# Patient Record
Sex: Female | Born: 1935 | Race: White | Hispanic: No | Marital: Married | Smoking: Former smoker
Health system: Southern US, Community
[De-identification: ages and names within clinical notes are randomized; demographics above are authoritative.]

## PROBLEM LIST (undated history)

## (undated) DIAGNOSIS — R0902 Hypoxemia: Secondary | ICD-10-CM

## (undated) DIAGNOSIS — I4891 Unspecified atrial fibrillation: Secondary | ICD-10-CM

## (undated) DIAGNOSIS — I509 Heart failure, unspecified: Secondary | ICD-10-CM

## (undated) DIAGNOSIS — C8590 Non-Hodgkin lymphoma, unspecified, unspecified site: Secondary | ICD-10-CM

## (undated) DIAGNOSIS — K116 Mucocele of salivary gland: Secondary | ICD-10-CM

## (undated) DIAGNOSIS — M1990 Unspecified osteoarthritis, unspecified site: Secondary | ICD-10-CM

## (undated) DIAGNOSIS — I2699 Other pulmonary embolism without acute cor pulmonale: Secondary | ICD-10-CM

## (undated) HISTORY — PX: APPENDECTOMY: SHX54

## (undated) HISTORY — DX: Heart failure, unspecified: I50.9

## (undated) HISTORY — PX: VERTEBROPLASTY: SHX113

## (undated) HISTORY — PX: ABDOMINAL HYSTERECTOMY: SHX81

## (undated) HISTORY — PX: BREAST BIOPSY: SHX20

## (undated) HISTORY — DX: Mucocele of salivary gland: K11.6

## (undated) HISTORY — DX: Non-Hodgkin lymphoma, unspecified, unspecified site: C85.90

## (undated) HISTORY — DX: Other pulmonary embolism without acute cor pulmonale: I26.99

## (undated) HISTORY — PX: MOHS SURGERY: SUR867

## (undated) HISTORY — DX: Unspecified osteoarthritis, unspecified site: M19.90

## (undated) HISTORY — DX: Hypoxemia: R09.02

## (undated) HISTORY — PX: CATARACT EXTRACTION: SUR2

## (undated) HISTORY — DX: Unspecified atrial fibrillation: I48.91

---

## 2004-03-17 ENCOUNTER — Ambulatory Visit: Payer: Self-pay | Admitting: Oncology

## 2004-05-29 ENCOUNTER — Ambulatory Visit: Payer: Self-pay | Admitting: Oncology

## 2004-06-17 ENCOUNTER — Ambulatory Visit: Payer: Self-pay | Admitting: Oncology

## 2004-07-18 ENCOUNTER — Ambulatory Visit: Payer: Self-pay | Admitting: Oncology

## 2004-09-26 ENCOUNTER — Ambulatory Visit: Payer: Self-pay | Admitting: Oncology

## 2004-10-15 ENCOUNTER — Ambulatory Visit: Payer: Self-pay | Admitting: Oncology

## 2004-11-15 ENCOUNTER — Ambulatory Visit: Payer: Self-pay | Admitting: Oncology

## 2004-12-15 ENCOUNTER — Ambulatory Visit: Payer: Self-pay | Admitting: Oncology

## 2005-01-24 ENCOUNTER — Ambulatory Visit: Payer: Self-pay | Admitting: Oncology

## 2005-02-15 ENCOUNTER — Ambulatory Visit: Payer: Self-pay | Admitting: Oncology

## 2005-03-17 ENCOUNTER — Ambulatory Visit: Payer: Self-pay | Admitting: Oncology

## 2005-05-23 ENCOUNTER — Ambulatory Visit: Payer: Self-pay | Admitting: Oncology

## 2005-06-17 ENCOUNTER — Ambulatory Visit: Payer: Self-pay | Admitting: Oncology

## 2005-09-24 ENCOUNTER — Ambulatory Visit: Payer: Self-pay | Admitting: Oncology

## 2005-10-15 ENCOUNTER — Ambulatory Visit: Payer: Self-pay | Admitting: Oncology

## 2005-11-15 ENCOUNTER — Ambulatory Visit: Payer: Self-pay | Admitting: Oncology

## 2005-12-30 ENCOUNTER — Ambulatory Visit: Payer: Self-pay | Admitting: Internal Medicine

## 2006-01-22 ENCOUNTER — Ambulatory Visit: Payer: Self-pay | Admitting: Oncology

## 2006-02-15 ENCOUNTER — Ambulatory Visit: Payer: Self-pay | Admitting: Oncology

## 2006-07-07 ENCOUNTER — Emergency Department: Payer: Self-pay | Admitting: Emergency Medicine

## 2006-07-23 ENCOUNTER — Ambulatory Visit: Payer: Self-pay | Admitting: Oncology

## 2006-08-16 ENCOUNTER — Ambulatory Visit: Payer: Self-pay | Admitting: Oncology

## 2007-01-07 ENCOUNTER — Ambulatory Visit: Payer: Self-pay | Admitting: Internal Medicine

## 2007-01-16 ENCOUNTER — Ambulatory Visit: Payer: Self-pay | Admitting: Oncology

## 2007-01-19 ENCOUNTER — Ambulatory Visit: Payer: Self-pay | Admitting: Oncology

## 2007-02-16 ENCOUNTER — Ambulatory Visit: Payer: Self-pay | Admitting: Oncology

## 2007-03-03 ENCOUNTER — Ambulatory Visit: Payer: Self-pay | Admitting: Family

## 2007-07-19 ENCOUNTER — Ambulatory Visit: Payer: Self-pay | Admitting: Oncology

## 2007-07-27 ENCOUNTER — Ambulatory Visit: Payer: Self-pay | Admitting: Oncology

## 2007-08-16 ENCOUNTER — Ambulatory Visit: Payer: Self-pay | Admitting: Oncology

## 2007-09-17 ENCOUNTER — Ambulatory Visit: Payer: Self-pay | Admitting: Family

## 2008-01-11 ENCOUNTER — Ambulatory Visit: Payer: Self-pay | Admitting: Oncology

## 2008-01-16 ENCOUNTER — Ambulatory Visit: Payer: Self-pay | Admitting: Oncology

## 2008-02-08 ENCOUNTER — Ambulatory Visit: Payer: Self-pay | Admitting: Oncology

## 2008-02-16 ENCOUNTER — Ambulatory Visit: Payer: Self-pay | Admitting: Oncology

## 2008-02-17 ENCOUNTER — Ambulatory Visit: Payer: Self-pay | Admitting: Family

## 2008-09-26 ENCOUNTER — Inpatient Hospital Stay: Payer: Self-pay | Admitting: Internal Medicine

## 2008-10-10 ENCOUNTER — Inpatient Hospital Stay: Payer: Self-pay | Admitting: Internal Medicine

## 2008-10-22 ENCOUNTER — Inpatient Hospital Stay: Payer: Self-pay | Admitting: Internal Medicine

## 2009-10-05 ENCOUNTER — Inpatient Hospital Stay: Payer: Self-pay | Admitting: Orthopedic Surgery

## 2009-10-06 ENCOUNTER — Encounter: Payer: Self-pay | Admitting: Internal Medicine

## 2009-10-15 ENCOUNTER — Encounter: Payer: Self-pay | Admitting: Internal Medicine

## 2009-11-01 ENCOUNTER — Ambulatory Visit: Payer: Self-pay | Admitting: Internal Medicine

## 2009-11-07 ENCOUNTER — Ambulatory Visit: Payer: Self-pay | Admitting: Internal Medicine

## 2009-11-08 ENCOUNTER — Encounter: Payer: Self-pay | Admitting: Internal Medicine

## 2009-11-09 DIAGNOSIS — I4891 Unspecified atrial fibrillation: Secondary | ICD-10-CM | POA: Insufficient documentation

## 2009-11-09 DIAGNOSIS — M810 Age-related osteoporosis without current pathological fracture: Secondary | ICD-10-CM | POA: Insufficient documentation

## 2009-11-09 DIAGNOSIS — R29898 Other symptoms and signs involving the musculoskeletal system: Secondary | ICD-10-CM | POA: Insufficient documentation

## 2009-11-09 DIAGNOSIS — Z86718 Personal history of other venous thrombosis and embolism: Secondary | ICD-10-CM

## 2009-11-10 DIAGNOSIS — Z8572 Personal history of non-Hodgkin lymphomas: Secondary | ICD-10-CM

## 2009-11-20 ENCOUNTER — Ambulatory Visit: Payer: Self-pay | Admitting: Internal Medicine

## 2009-11-20 DIAGNOSIS — I5022 Chronic systolic (congestive) heart failure: Secondary | ICD-10-CM

## 2009-11-21 LAB — CONVERTED CEMR LAB
Albumin: 4.5 g/dL (ref 3.5–5.2)
BUN: 11 mg/dL (ref 6–23)
Basophils Absolute: 0 10*3/uL (ref 0.0–0.1)
CO2: 31 meq/L (ref 19–32)
Calcium: 9.9 mg/dL (ref 8.4–10.5)
Creatinine, Ser: 0.7 mg/dL (ref 0.4–1.2)
Digitoxin Lvl: 1.2 ng/mL (ref 0.8–2.0)
Eosinophils Absolute: 0.1 10*3/uL (ref 0.0–0.7)
Glucose, Bld: 101 mg/dL — ABNORMAL HIGH (ref 70–99)
HCT: 42.2 % (ref 36.0–46.0)
Lymphs Abs: 1.7 10*3/uL (ref 0.7–4.0)
MCHC: 34.4 g/dL (ref 30.0–36.0)
MCV: 98.2 fL (ref 78.0–100.0)
Monocytes Absolute: 0.4 10*3/uL (ref 0.1–1.0)
Neutrophils Relative %: 70.3 % (ref 43.0–77.0)
Platelets: 170 10*3/uL (ref 150.0–400.0)
RDW: 13.7 % (ref 11.5–14.6)
Sodium: 141 meq/L (ref 135–145)
WBC: 7.5 10*3/uL (ref 4.5–10.5)

## 2010-02-28 ENCOUNTER — Ambulatory Visit: Payer: Self-pay | Admitting: Internal Medicine

## 2010-05-16 ENCOUNTER — Ambulatory Visit: Payer: Self-pay | Admitting: Internal Medicine

## 2010-05-16 ENCOUNTER — Encounter: Payer: Self-pay | Admitting: Internal Medicine

## 2010-05-16 DIAGNOSIS — I959 Hypotension, unspecified: Secondary | ICD-10-CM

## 2010-05-17 LAB — CONVERTED CEMR LAB
BUN: 17 mg/dL (ref 6–23)
Basophils Absolute: 0 10*3/uL (ref 0.0–0.1)
CO2: 32 meq/L (ref 19–32)
Creatinine, Ser: 0.9 mg/dL (ref 0.4–1.2)
Digitoxin Lvl: 0.2 ng/mL — ABNORMAL LOW (ref 0.8–2.0)
Eosinophils Relative: 1.1 % (ref 0.0–5.0)
GFR calc non Af Amer: 66.49 mL/min (ref >60)
Glucose, Bld: 92 mg/dL (ref 70–99)
HCT: 44.2 % (ref 36.0–46.0)
Lymphs Abs: 1.8 10*3/uL (ref 0.7–4.0)
Monocytes Absolute: 0.5 10*3/uL (ref 0.1–1.0)
Monocytes Relative: 6.6 % (ref 3.0–12.0)
Neutrophils Relative %: 66.5 % (ref 43.0–77.0)
Platelets: 161 10*3/uL (ref 150.0–400.0)
RDW: 13.4 % (ref 11.5–14.6)
Sodium: 137 meq/L (ref 135–145)
WBC: 6.9 10*3/uL (ref 4.5–10.5)

## 2010-05-25 ENCOUNTER — Ambulatory Visit: Payer: Self-pay | Admitting: Internal Medicine

## 2010-07-17 NOTE — Letter (Signed)
[facility]   Imported By: Beau Fanny 11/10/2009 14:40:56  _____________________________________________________________________  External Attachment:    Type:   Image     Comment:   External Document

## 2010-07-17 NOTE — Assessment & Plan Note (Signed)
Summary: ROA FOR 2 WEEK FOLLOW-UP/JRR   Vital Signs:  Patient profile:   77 year old female Weight:      113 pounds Temp:     97.7 degrees F oral Pulse rate:   92 / minute Pulse rhythm:   regular BP sitting:   110 / 60  (left arm) Cuff size:   regular  Vitals Entered By: Mervin Hack CMA Duncan Dull) (May 25, 2010 2:40 PM)  History of Present Illness: here with daughter Ruben Gottron  feels better now BP has been running 102/70's still with some dizziness upon just getting up---has to watch balance. She clearly describes a balance issue as opposed to an orthostatic issue  has not used the lasix No SOB  appetite still not great 2 ensures daily though  No chest pain Occ racing sensation  Allergies: No Known Drug Allergies  Past History:  Past medical, surgical, family and social histories (including risk factors) reviewed for relevance to current acute and chronic problems.  Past Medical History: Reviewed history from 11/09/2009 and no changes required. Congestive Heart Failure-----toxicity from chemo Atrial fibrillation-------------------------off coumadin per Dr. Gwen Pounds Non-Hodgkin's Lymphoma Osteoarthritis Pulmonary embolism--- with lymphoma Osteoporosis Parotid Cyst  Past Surgical History: Reviewed history from 11/09/2009 and no changes required. Hysterectomy Cataract extraction Appendectomy Breast Biopsies T12 Vertebroplasty Moh's surgery times 2-face  Family History: Reviewed history from 11/09/2009 and no changes required. Dad died in 75's  of cancer Mom died @89  of heart disease Brother and sister died of cancer  Social History: Reviewed history from 11/09/2009 and no changes required. 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  NAD Neck:  supple, no  masses, and no thyromegaly.   Lungs:  normal respiratory effort, no intercostal retractions, no accessory muscle use, and normal breath sounds.   Heart:  normal rate, regular rhythm, no murmur, and no gallop.   Extremities:  no edema Psych:  normally interactive, good eye contact, not anxious appearing, and not depressed appearing.     Impression & Recommendations:  Problem # 1:  HYPOTENSION (ICD-458.9) Assessment Improved feels better and BP up some no orthostasis --just some balance issues hasn't needed the lasix  Problem # 2:  CHRONIC SYSTOLIC HEART FAILURE (ICD-428.22) Assessment: Unchanged stable fluid status without the lasix has weight parameters to restart this  Her updated medication list for this problem includes:    Lasix 20 Mg Tabs (Furosemide) .Marland Kitchen... Take 1 by mouth once daily if home weight is at or over 115#    Digoxin 0.125 Mg Tabs (Digoxin) .Marland Kitchen... Take 1  tablet by mouth daily  Problem # 3:  ATRIAL FIBRILLATION (ICD-427.31) Assessment: Unchanged rate sounds regular now continue dig since rate 90 on exam today  Her updated medication list for this problem includes:    Digoxin 0.125 Mg Tabs (Digoxin) .Marland Kitchen... Take 1  tablet by mouth daily  Complete Medication List: 1)  Lasix 20 Mg Tabs (Furosemide) .... Take 1 by mouth once daily if home weight is at or over 115# 2)  Digoxin 0.125 Mg Tabs (Digoxin) .... Take 1  tablet by mouth daily 3)  Klor-con 20 Meq Pack (Potassium chloride) .... Take one tablet by mouth daily 4)  Vitamin D (ergocalciferol) 50000 Unit Caps (Ergocalciferol) .... Take 1 by mouth once monthly 5)  Calcium-vitamin D 500-125 Mg-unit Tabs (Calcium-vitamin d) .... Take 1 by mouth two times a day 6)  Miralax Powd (Polyethylene glycol 3350) .... Take 1 capful in 8oz of water as needed  Patient Instructions: 1)  Please schedule a follow-up appointment in 2 -3 months.  2)  Please cancel the January appt   Orders Added: 1)  Est. Patient Level IV  [81191]    Current Allergies (reviewed today): No known allergies

## 2010-07-17 NOTE — Medication Information (Signed)
Summary[facility] Med[facility]  [facility] Medication [facility]   Imported By: Beau Fanny 11/10/2009 09:23:54  _____________________________________________________________________  External Attachment:    Type:   Image     Comment:   External Document

## 2010-07-17 NOTE — Letter (Signed)
Summary: [facility]  [facility] Face Sheet   Imported By: Beau Fanny 11/02/2009 15:09:54  _____________________________________________________________________  External Attachment:    Type:   Image     Comment:   External Document

## 2010-07-17 NOTE — Assessment & Plan Note (Signed)
Summary: FOLLOW UP / LFW   Vital Signs:  Patient profile:   77 year old female Weight:      109 pounds Temp:     97.6 degrees F oral Pulse rate:   88 / minute Pulse rhythm:   regular BP sitting:   110 / 60  (left arm) Cuff size:   regular  Vitals Entered By: Mervin Hack CMA (AAMA) (February 28, 2010 10:14 AM) CC: 3 month follow-up   History of Present Illness: Doing well Got ride here from [facility] transportation Has been independent again Daughter comes from [place] to shop for her She cooks her meals and occ goes to [employer] Has someone to clean her house  still has dizziness--goes back many years No syncope Walks with walker or cane has motorized scooter for transportation on campus  No chest pain Gets SOB---- mostly DOE which has worsened slightly Really can't lift now but still able to do laundry Rarely has a heart flutter-- no associated symptoms  No arthritis pain still on calcium and vitamin D  still does her home exercise program  Allergies: No Known Drug Allergies  Past History:  Past medical, surgical, family and social histories (including risk factors) reviewed for relevance to current acute and chronic problems.  Past Medical History: Reviewed history from 11/09/2009 and no changes required. Congestive Heart Failure-----toxicity from chemo Atrial fibrillation-------------------------off coumadin per Dr. Gwen Pounds Non-Hodgkin's Lymphoma Osteoarthritis Pulmonary embolism--- with lymphoma Osteoporosis Parotid Cyst  Past Surgical History: Reviewed history from 11/09/2009 and no changes required. Hysterectomy Cataract extraction Appendectomy Breast Biopsies T12 Vertebroplasty Moh's surgery times 2-face  Family History: Reviewed history from 11/09/2009 and no changes required. Dad died in 4's  of cancer Mom died @89  of heart disease Brother and sister died of cancer  Social History: Reviewed history from 11/09/2009 and no  changes required. 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   Neck:  supple, no masses, no thyromegaly, no carotid bruits, and no cervical lymphadenopathy.   Lungs:  normal respiratory effort, no intercostal retractions, no accessory muscle use, and normal breath sounds.   Heart:  normal rate, regular rhythm, no murmur, and no gallop.   Abdomen:  soft and non-tender.   Msk:  no joint tenderness and no joint swelling.   Extremities:  trace edema Psych:  normally interactive, good eye contact, not anxious appearing, and not depressed appearing.     Impression & Recommendations:  Problem # 1:  OSTEOPOROSIS (ICD-733.00) Assessment Comment Only pelvic fracture healed discussed need to continue her exercise program  Her updated medication list for this problem includes:    Vitamin D (ergocalciferol) 50000 Unit Caps (Ergocalciferol) .Marland Kitchen... Take 1 by mouth once monthly    Calcium-vitamin D 500-125 Mg-unit Tabs (Calcium-vitamin d) .Marland Kitchen... Take 1 by mouth two times a day  Problem # 2:  CHRONIC SYSTOLIC HEART FAILURE (ICD-428.22) Assessment: Unchanged no sig change May have slight decline in exercise tolerance but not sig  Her updated medication list for this problem includes:    Lasix 20 Mg Tabs (Furosemide) .Marland Kitchen... Take 1 by mouth once daily    Digoxin 0.125 Mg Tabs (Digoxin) .Marland Kitchen... Take 1/2  tablet by mouth daily  Problem # 3:  ATRIAL  FIBRILLATION (ICD-427.31) Assessment: Unchanged still regular will decrease dig to 1/2 daily as level was 1.2 and appetite is off some  Her updated medication list for this problem includes:    Digoxin 0.125 Mg Tabs (Digoxin) .Marland Kitchen... Take 1/2  tablet by mouth daily  Problem # 4:   OSTEOARTHRITIS (ICD-715.90) Assessment: Unchanged no major pain issues  Complete Medication List: 1)  Lasix 20 Mg Tabs (Furosemide) .... Take 1 by mouth once daily 2)  Digoxin 0.125 Mg Tabs (Digoxin) .... Take 1/2  tablet by mouth daily 3)  Klor-con 20 Meq Pack (Potassium chloride) .... Take one tablet by mouth daily 4)  Vitamin D (ergocalciferol) 50000 Unit Caps (Ergocalciferol) .... Take 1 by mouth once monthly 5)  Calcium-vitamin D 500-125 Mg-unit Tabs (Calcium-vitamin d) .... Take 1 by mouth two times a day 6)  Miralax Powd (Polyethylene glycol 3350) .... Take 1 capful in 8oz of water as needed  Patient Instructions: 1)  Please decrease the digoxin to 1/2 tab daily 2)  Please schedule a follow-up appointment in 4 months .  Prescriptions: VITAMIN D (ERGOCALCIFEROL) 50000 UNIT CAPS (ERGOCALCIFEROL) take 1 by mouth once monthly  #12 x 0   Entered and Authorized by:   Cindee Salt MD   Signed by:   Cindee Salt MD on 02/28/2010   Method used:   Electronically to        [facility]* (retail)       [address]       [place], [place]  [address]       Ph: [phone]       Fax: [phone]   RxID:   [id number]   Current Allergies (reviewed today): No known allergies   Appended Document: FOLLOW UP / LFW       Influenza Vaccine    Vaccine Type: Fluvax 3+    Site: left deltoid    Mfr: GlaxoSmithKline    Dose: 0.5 ml    Route: IM    Given by: Mervin Hack CMA (AAMA)    Exp. Date: 12/15/2010    Lot #: [id number]    VIS given: 01/09/10 version given February 28, 2010.  Flu Vaccine Consent Questions    Do you have a history of severe allergic reactions to this vaccine? no    Any prior history of allergic reactions to egg and/or gelatin? no    Do you have a sensitivity to the preservative Thimersol? no    Do you have a past history of Guillan-Barre Syndrome? no    Do you currently have an acute febrile illness? no    Have  you ever had a severe reaction to latex? no    Vaccine information given and explained to patient? yes    Are you currently pregnant? no

## 2010-07-17 NOTE — Miscellaneous (Signed)
Summary: [facility] For A Natural Death  [facility]ive For A Natural Death   Imported By: Beau Fanny 11/09/2009 14:40:03  _____________________________________________________________________  External Attachment:    Type:   Image     Comment:   External Document

## 2010-07-17 NOTE — Consult Note (Signed)
Summary: [facility]  [facility]   Imported By: Beau Fanny 11/10/2009 09:22:08  _____________________________________________________________________  External Attachment:    Type:   Image     Comment:   External Document

## 2010-07-17 NOTE — Assessment & Plan Note (Signed)
Summary: [facility] CALLED TO MAKE PER DR LETVAK 15 MIN NEW PT/DLO   Vital Signs:  Patient profile:   77 year old female Height:      63 inches Weight:      113 pounds BMI:     20.09 Temp:     98.1 degrees F oral Pulse rate:   72 / minute Pulse rhythm:   regular BP sitting:   110 / 60  (left arm) Cuff size:   regular  Vitals Entered By: Mervin Hack CMA Duncan Dull) (November 20, 2009 11:30 AM) CC: follow-up visit   History of Present Illness: Here with daughter Wynona Canes  Has been doing okay at home Not much energy Pain is gone from pelvic fracture No sig back pain  Daughters have been staying with her and managing household tasks she is taking over some tasks Will be alone again in 2 days (daughter lives out of town) Daughter from [place] comes on weekends and helps her shop  No chest pain Still with dizziness (goes back 2 years)-- no real change.  Dizzy whenever she is on feet No chest pain Has stable DOE No palpitations   Allergies: No Known Drug Allergies  Past History:  Past Medical History: Last updated: 23-Nov-2009 Congestive Heart Failure-----toxicity from chemo Atrial fibrillation-------------------------off coumadin per Dr. 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   Neck:  supple, no masses, no thyromegaly, and no cervical lymphadenopathy.   Lungs:  normal respiratory effort and normal breath sounds.   Heart:  normal rate, regular rhythm, no murmur, and no gallop.   Extremities:  trace edema Neurologic:  walks well with the cane Psych:  normally interactive, good eye contact, not anxious appearing, and not depressed appearing.     Impression & Recommendations:  Problem # 1:  OSTEOPOROSIS (ICD-733.00) Assessment Improved pelvic fracture healed  The following medications were removed from the medication list:    Calcium 1200-1000 Mg-unit Chew (Calcium carbonate-vit d-min) .Marland Kitchen... Take one tablet by mouth daily Her updated medication list for this problem includes:    Vitamin D (ergocalciferol) 50000 Unit Caps (Ergocalciferol) .Marland Kitchen... Take 1 by mouth once monthly    Calcium-vitamin D 500-125 Mg-unit Tabs (Calcium-vitamin d) .Marland Kitchen... Take 1 by mouth two times a day  Problem # 2:  ATRIAL FIBRILLATION (ICD-427.31) Assessment: Comment Only sounds regular now no coumadin per cardiologist Dr Gwen Pounds  The following medications were removed from the medication list:    Aspirin 325 Mg Tabs (Aspirin) .Marland Kitchen... Take one tablet by mouth daily Her updated medication list for this problem includes:    Digoxin 0.125 Mg Tabs (Digoxin) .Marland Kitchen... 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 Take one tablet by mouth daily    Lasix 20 Mg Tabs (Furosemide) .Marland Kitchen... Take 1 by mouth once daily  Orders: TLB-Renal Function Panel (80069-RENAL) TLB-CBC Platelet - w/Differential (85025-CBCD) Venipuncture  (19147) TLB-Digoxin (Lanoxin) (80162-DIG)  Problem # 4:  OSTEOARTHRITIS (ICD-715.90) no sig pain  no meds needed  Complete Medication List: 1)  Klor-con 20 Meq Pack (Potassium chloride) .... Take one tablet by mouth daily 2)  Digoxin 0.125 Mg Tabs (Digoxin) .... Take one tablet by mouth daily 3)  Lasix 20 Mg Tabs (Furosemide) .... Take 1 by mouth once daily 4)  Vitamin D (ergocalciferol) 50000 Unit Caps (Ergocalciferol) .... Take 1 by mouth once monthly 5)  Senokot 8.6 Mg Tabs (Sennosides) .... Take 1 by mouth two times a day 6)  Calcium-vitamin D 500-125 Mg-unit Tabs (Calcium-vitamin d) .... Take 1 by mouth two times a day 7)  Miralax Powd (Polyethylene glycol 3350) .... Take 1 capful in 8oz of water as needed  Patient Instructions: 1)  Please schedule a follow-up appointment in 3 months .   Current Allergies (reviewed today): No known allergies

## 2010-07-17 NOTE — Progress Notes (Signed)
Summary: [facility] Admission/Annual Physical Exam  [facility] Admission/Annual Physical Exam   Imported By: Beau Fanny 11/10/2009 09:24:48  _____________________________________________________________________  External Attachment:    Type:   Image     Comment:   External Document

## 2010-07-17 NOTE — Assessment & Plan Note (Signed)
Summary: BLOOD PRESSURE IS DOWN   Vital Signs:  Patient profile:   77 year old female Weight:      111 pounds Temp:     97.8 degrees F oral Pulse rate:   84 / minute Pulse rhythm:   regular BP sitting:   118 / 68  (left arm) Cuff size:   regular  Vitals Entered By: Mervin Hack CMA Duncan Dull) (May 16, 2010 12:58 PM) CC: low blood pressure   History of Present Illness: BP has been going down She checks herself with digital cuff BP down to 88/58 and 92/66 in past few days dizziness is worse ---"all the time" Not when in bed but occ when sitting Mostly when up  Slight edema only No chest pain No SOB at rest but has stable DOE Has had sense of heart racing some  Sleeps on 2 pillows--no change No PND  weighs regularly 109# on her scale pretty consistently   Allergies: No Known Drug Allergies  Past History:  Past medical, surgical, family and social histories (including risk factors) reviewed for relevance to current acute and chronic problems.  Past Medical History: Reviewed history from 11/09/2009 and no changes required. Congestive Heart Failure-----toxicity from chemo Atrial fibrillation-------------------------off coumadin per Dr. Gwen Pounds Non-Hodgkin's Lymphoma Osteoarthritis Pulmonary embolism--- with lymphoma Osteoporosis Parotid Cyst  Past Surgical History: Reviewed history from 11/09/2009 and no changes required. Hysterectomy Cataract extraction Appendectomy Breast Biopsies T12 Vertebroplasty Moh's surgery times 2-face  Family History: Reviewed history from 11/09/2009 and no changes required. Dad died in 66's  of cancer Mom died @89  of heart disease Brother and sister died of cancer  Social History: Reviewed history from 11/09/2009 and no changes required. 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  NAD Neck:  supple, no masses, no thyromegaly, and no cervical lymphadenopathy.   Lungs:  normal respiratory effort, no intercostal retractions, no accessory muscle use, and normal breath sounds.   Heart:  normal rate, no murmur, no gallop, and irregular rhythm.   Abdomen:  soft and non-tender.   Extremities:  no edema Psych:  normally interactive, good eye contact, not anxious appearing, and not depressed appearing.     Impression & Recommendations:  Problem # 1:  HYPOTENSION (ICD-458.9) Assessment New  no acute symptoms  EKG not normal but nothing clearly acute may be related to poor eating  will change the lasix to as needed  ER eval if worsens will check labs increase ensure  Orders: TLB-Renal Function Panel (80069-RENAL) TLB-CBC Platelet - w/Differential (85025-CBCD) T-Digoxin (16[address]-60454) Venipuncture (09811) Specimen Handling (91478) EKG w/ Interpretation (93000)  Problem # 2:  CHRONIC SYSTOLIC HEART FAILURE (ICD-428.22) Assessment: Unchanged compensated will change lasix to as needed with weight parameters  Her updated medication list for this problem includes:    Lasix 20 Mg Tabs (Furosemide) .Marland Kitchen... Take 1 by mouth once daily if home weight is at or over 115#    Digoxin 0.125 Mg Tabs (Digoxin) .Marland Kitchen... Take 1/2  tablet by mouth daily  Problem # 3:  ATRIAL FIBRILLATION (ICD-427.31) Assessment: Unchanged good rate control  Her updated medication list for this problem includes:    Digoxin 0.125 Mg Tabs (Digoxin) .Marland Kitchen... Take 1/2  tablet by mouth daily  Complete Medication List: 1)  Lasix  20 Mg Tabs (Furosemide) .... Take 1 by mouth once daily if home weight is at or over 115# 2)  Digoxin 0.125 Mg Tabs (Digoxin) .... Take 1/2  tablet by mouth daily 3)  Klor-con 20 Meq Pack (Potassium chloride) .... Take one tablet by mouth daily 4)  Vitamin D  (ergocalciferol) 50000 Unit Caps (Ergocalciferol) .... Take 1 by mouth once monthly 5)  Calcium-vitamin D 500-125 Mg-unit Tabs (Calcium-vitamin d) .... Take 1 by mouth two times a day 6)  Miralax Powd (Polyethylene glycol 3350) .... Take 1 capful in 8oz of water as needed  Patient Instructions: 1)  Please take the furosemide only if your weight is 115# or higher at home 2)  Please increase the ensure to two times a day 3)  Please schedule a follow-up appointment in 2 weeks.    Orders Added: 1)  TLB-Renal Function Panel [80069-RENAL] 2)  TLB-CBC Platelet - w/Differential [85025-CBCD] 3)  T-Digoxin [66440-34742] 4)  Venipuncture [59563] 5)  Specimen Handling [99000] 6)  Est. Patient Level IV [87564] 7)  EKG w/ Interpretation [93000]    Current Allergies (reviewed today): No known allergies

## 2010-07-17 NOTE — Letter (Signed)
Summary: [facility] Patient History,Problem List  [facility] Patient History,Problem List   Imported By: Beau Fanny 11/10/2009 09:26:05  _____________________________________________________________________  External Attachment:    Type:   Image     Comment:   External Document

## 2010-08-13 ENCOUNTER — Encounter: Payer: Self-pay | Admitting: Internal Medicine

## 2010-08-13 ENCOUNTER — Ambulatory Visit: Payer: 59 | Admitting: Internal Medicine

## 2010-08-23 NOTE — Assessment & Plan Note (Signed)
Summary: 3 MONTH FOLLOW UP R/S FROM 08/17/10   Vital Signs:  Patient profile:   77 year old female Weight:      113 pounds Temp:     97.7 degrees F oral Pulse rate:   86 / minute Pulse rhythm:   regular BP sitting:   118 / 70  (left arm) Cuff size:   regular  Vitals Entered By: Mervin Hack CMA [facility] Dull) (August 13, 2010 3:09 PM) CC: follow-up, Hypertension Management   History of Present Illness: DOing okay  Has new basal cell carcinoma Moh's surgery scheduled  Still with weakness and dizziness "No energy" Did try to cut back on the lasix but noted feet swelling Dizziness was better off the med--but persisted Uses cane or walker No falls  No chest pain No palpitations No sig SOB  Still does leg exercises including exercise bike --from PT No problems with arthritis pain  Hypertension History:      Positive major cardiovascular risk factors include female age 62 years old or older.  Negative major cardiovascular risk factors include non-tobacco-user status.        Positive history for target organ damage include cardiac end organ damage (either CHF or LVH).     Allergies: No Known Drug Allergies  Past History:  Past medical, surgical, family and social histories (including risk factors) reviewed for relevance to current acute and chronic problems.  Past Medical History: Reviewed history from 11/09/2009 and no changes required. Congestive Heart Failure-----toxicity from chemo Atrial fibrillation-------------------------off coumadin per Dr. Gwen Pounds Non-Hodgkin's Lymphoma Osteoarthritis Pulmonary embolism--- with lymphoma Osteoporosis Parotid Cyst  Past Surgical History: Reviewed history from 11/09/2009 and no changes required. Hysterectomy Cataract extraction Appendectomy Breast Biopsies T12 Vertebroplasty Moh's surgery times 2-face  Family History: Reviewed history from 11/09/2009 and no changes required. Dad died in 3's  of cancer Mom died  @89  of heart disease Brother and sister died of cancer  Social History: Reviewed history from 11/09/2009 and no changes required. 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   Neck:  supple, no masses, no thyromegaly, and no cervical lymphadenopathy.   Lungs:  normal respiratory effort, no intercostal retractions, no accessory muscle use, and normal breath sounds.   Heart:  normal rate, regular rhythm, no murmur, and no gallop.   Abdomen:  soft and non-tender.   Extremities:  no sig edema Psych:  normally interactive, good eye contact, not anxious appearing, and not depressed appearing.     Impression & Recommendations:  Problem # 1:  CHRONIC SYSTOLIC HEART FAILURE (ICD-428.22) Assessment Unchanged compensated I think some of her dizziness and weakness could be lasix will change to just 3 times per week (it took 2 weeks of it entirely till she noted swelling again)  Her updated medication list for this problem includes:    Lasix 20 Mg Tabs (Furosemide) .Marland Kitchen... Take 1 by mouth once daily on monday, wednesday and friday    Digoxin 0.125 Mg Tabs (Digoxin) .Marland Kitchen... Take 1  tablet by mouth daily    Aspirin 81 Mg Tbec (Aspirin) .Marland Kitchen... 1 by mouth every other day  Problem # 2:  ATRIAL FIBRILLATION (ICD-427.31) Assessment: Unchanged still regular will continue dig will add back aspirin 81mg  every other day  Her updated  medication list for this problem includes:    Digoxin 0.125 Mg Tabs (Digoxin) .Marland Kitchen... Take 1  tablet by mouth daily    Aspirin 81 Mg Tbec (Aspirin) .Marland Kitchen... 1 by mouth every other day  Problem # 3:  OSTEOARTHRITIS (ICD-715.90) Assessment: Unchanged no recent pain problems  Problem # 4:  OSTEOPOROSIS (ICD-733.00) Assessment:  Unchanged stopped the vitamin D---I will refill it  Her updated medication list for this problem includes:    Vitamin D (ergocalciferol) 50000 Unit Caps (Ergocalciferol) .Marland Kitchen... Take 1 by mouth once monthly    Calcium-vitamin D 500-125 Mg-unit Tabs (Calcium-vitamin d) .Marland Kitchen... Take 1 by mouth two times a day  Complete Medication List: 1)  Lasix 20 Mg Tabs (Furosemide) .... Take 1 by mouth once daily on monday, wednesday and friday 2)  Digoxin 0.125 Mg Tabs (Digoxin) .... Take 1  tablet by mouth daily 3)  Klor-con 20 Meq Pack (Potassium chloride) .... Take one tablet by mouth daily on monday, wednesday and friday 4)  Vitamin D (ergocalciferol) 50000 Unit Caps (Ergocalciferol) .... Take 1 by mouth once monthly 5)  Calcium-vitamin D 500-125 Mg-unit Tabs (Calcium-vitamin d) .... Take 1 by mouth two times a day 6)  Miralax Powd (Polyethylene glycol 3350) .... Take 1 capful in 8oz of water as needed 7)  Aspirin 81 Mg Tbec (Aspirin) .Marland Kitchen.. 1 by mouth every other day  Hypertension Assessment/Plan:      The patient's hypertensive risk group is category C: Target organ damage and/or diabetes.  Today's blood pressure is 118/70.    Patient Instructions: 1)  Please increase to 2 ensures a day 2)  Please schedule a follow-up appointment in 4 months .  Prescriptions: VITAMIN D (ERGOCALCIFEROL) 50000 UNIT CAPS (ERGOCALCIFEROL) take 1 by mouth once monthly  #12 x 0   Entered and Authorized by:   Cindee Salt MD   Signed by:   Cindee Salt MD on 08/13/2010   Method used:   Electronically to        [facility]* (retail)       [address]       [place], [place]  [address]       Ph: [phone]       Fax: [phone]   RxID:   [id number]      Current Allergies (reviewed today): No known allergies

## 2010-12-05 ENCOUNTER — Encounter: Payer: Self-pay | Admitting: Internal Medicine

## 2010-12-07 ENCOUNTER — Encounter: Payer: Self-pay | Admitting: Internal Medicine

## 2010-12-07 ENCOUNTER — Ambulatory Visit (INDEPENDENT_AMBULATORY_CARE_PROVIDER_SITE_OTHER): Payer: 59 | Admitting: Internal Medicine

## 2010-12-07 VITALS — BP 100/68 | HR 104 | Temp 98.3°F | Ht 63.0 in | Wt 116.0 lb

## 2010-12-07 DIAGNOSIS — I5022 Chronic systolic (congestive) heart failure: Secondary | ICD-10-CM

## 2010-12-07 DIAGNOSIS — M1990 Unspecified osteoarthritis, unspecified site: Secondary | ICD-10-CM

## 2010-12-07 DIAGNOSIS — I4891 Unspecified atrial fibrillation: Secondary | ICD-10-CM

## 2010-12-07 NOTE — Assessment & Plan Note (Signed)
Off the dig per Dr Gwen Pounds Level was very low anyway Has rare palpitations that last only seconds Just on aspirin daily

## 2010-12-07 NOTE — Assessment & Plan Note (Signed)
Hasn't been an issue of late No meds recently

## 2010-12-07 NOTE — Progress Notes (Signed)
Subjective:    Patient ID: Rita Vasquez, female    DOB: [birth year], 77 y.o.   MRN: [id number]  HPI Doing well Notes some SOB--more often now. With exertion--like changing bed, carrying laundry. Rests briefly and then resumes Daughter does most of grocery shopping  Still with mild edema on lasix every other day Dr Gwen Pounds stopped the digoxin occ gets sensation of fluttering in chest---once a week for seconds No chest pain Stable uses 2 pillows, no PND  No arthritis pain No meds needed Does regular leg exercises--not much for walking  Current Outpatient Prescriptions on File Prior to Visit  Medication Sig Dispense Refill  . aspirin 81 MG tablet Take 81 mg by mouth daily.        . Calcium-Vitamin D 500-125 MG-UNIT TABS Take by mouth 2 (two) times daily.        . furosemide (LASIX) 20 MG tablet Take 20 mg by mouth every other day.        . polyethylene glycol (MIRALAX / GLYCOLAX) packet Take 17 g by mouth daily as needed.        . potassium chloride (KLOR-CON) 20 MEQ packet Take 20 mEq by mouth every other day.        . Vitamin D, Ergocalciferol, (DRISDOL) 50000 UNITS CAPS Take 50,000 Units by mouth every 30 (thirty) days.        Marland Kitchen DISCONTD: digoxin (LANOXIN) 0.125 MG tablet Take 125 mcg by mouth daily.         Past Medical History  Diagnosis Date  . CHF (congestive heart failure)   . Atrial fibrillation   . Non Hodgkin's lymphoma   . Arthritis   . Pulmonary embolism   . Osteoporosis   . Parotid cyst     Past Surgical History  Procedure Date  . Abdominal hysterectomy   . Cataract extraction   . Appendectomy   . Breast biopsy   . Vertebroplasty   . Mohs surgery     Family History  Problem Relation Age of Onset  . Cancer Sister   . Cancer Brother     History   Social History  . Marital Status: Widowed    Spouse Name: N/A    Number of Children: 3  . Years of Education: N/A   Occupational History  . 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 Smoking status: Former Games developer  . Smokeless tobacco: Never Used  . Alcohol Use: No  . Drug Use: No  . Sexually Active: Not on file   Other Topics Concern  . Not on file   Social History Narrative  . No narrative on file   Review of Systems Tires easily but not as bad as it was Appetite is not great--still tries to eat as well as possible Weight is up a couple of pounds No depression Has another basal cell on nose---had Moh's surgery on left side in march and then skin graft     Objective:   Physical Exam  Constitutional: She appears well-developed and well-nourished. No distress.  Neck: Normal range of motion. Neck supple. No thyromegaly present.  Cardiovascular: Normal rate, regular rhythm and normal heart sounds.  Exam reveals no gallop.   No murmur heard.      HR close to 100  Pulmonary/Chest: Effort normal and breath sounds normal. No respiratory distress. She has no wheezes. She has no rales.  Abdominal: Soft. There is no tenderness.  Musculoskeletal:  Normal range of motion. She exhibits edema. She exhibits no tenderness.       Trace of ankle edema  Lymphadenopathy:    She has no cervical adenopathy.  Skin:       Surgical area looks fine---hyperpigmented from graft (left side of nose)  Psychiatric: She has a normal mood and affect. Her behavior is normal. Judgment and thought content normal.          Assessment & Plan:

## 2010-12-07 NOTE — Assessment & Plan Note (Addendum)
Has NYHA class 2 symptoms Could be deconditioning as well Neutral fluid status so no changes Seems to feel better on less furosemide (every other day)

## 2010-12-14 ENCOUNTER — Ambulatory Visit: Payer: 59 | Admitting: Internal Medicine

## 2010-12-18 ENCOUNTER — Encounter: Payer: Self-pay | Admitting: Internal Medicine

## 2010-12-18 ENCOUNTER — Ambulatory Visit (INDEPENDENT_AMBULATORY_CARE_PROVIDER_SITE_OTHER): Payer: 59 | Admitting: Internal Medicine

## 2010-12-18 DIAGNOSIS — I4891 Unspecified atrial fibrillation: Secondary | ICD-10-CM

## 2010-12-18 DIAGNOSIS — I5022 Chronic systolic (congestive) heart failure: Secondary | ICD-10-CM

## 2010-12-18 MED ORDER — DIGOXIN 125 MCG PO TABS: 125.0000 ug | ORAL_TABLET | Freq: Every day | ORAL | Status: DC

## 2010-12-18 NOTE — Assessment & Plan Note (Signed)
Still regular but tachycardic Will restart the digoxin

## 2010-12-18 NOTE — Patient Instructions (Signed)
Please increase the furosemide to 20mg  daily Please restart the digoxin 125 mcg daily Please weigh yourself daily and keep a record. Bring this in with you for your follow up appointment

## 2010-12-18 NOTE — Assessment & Plan Note (Signed)
Exacerbated with dyspnea even at rest and some PND May have concommitant diastolic dysfunction esp with the tachycardia Weight is up 4# since last visit and 7# from February  P: restart digoxin      Change lasix to daily      Close follow up

## 2010-12-18 NOTE — Progress Notes (Signed)
  Subjective:    Patient ID: Rita Vasquez, female    DOB: 12-Jun-1916, 77 y.o.   MRN: [id number]  HPI Had Moh's surgery last Friday  Now feels worse Dizzy, short of breath "no matter what I do" Some dyspnea just at rest Some cough--thin clear mucus  No weight gain No sig edema  Sleeps okay Props on 2 pillows--no recent changed occ PND--just recently  Current Outpatient Prescriptions on File Prior to Visit  Medication Sig Dispense Refill  . aspirin 81 MG tablet Take 81 mg by mouth daily.        . Calcium-Vitamin D 500-125 MG-UNIT TABS Take by mouth 2 (two) times daily.        . furosemide (LASIX) 20 MG tablet Take 20 mg by mouth every other day.        . polyethylene glycol (MIRALAX / GLYCOLAX) packet Take 17 g by mouth daily as needed.        . potassium chloride (KLOR-CON) 20 MEQ packet Take 20 mEq by mouth every other day.        . Vitamin D, Ergocalciferol, (DRISDOL) 50000 UNITS CAPS Take 50,000 Units by mouth every 30 (thirty) days.          No Known Allergies  Past Medical History  Diagnosis Date  . CHF (congestive heart failure)   . Atrial fibrillation   . Non Hodgkin's lymphoma   . Arthritis   . Pulmonary embolism   . Osteoporosis   . Parotid cyst     Past Surgical History  Procedure Date  . Abdominal hysterectomy   . Cataract extraction   . Appendectomy   . Breast biopsy   . Vertebroplasty   . Mohs surgery     Family History  Problem Relation Age of Onset  . Cancer Sister   . Cancer Brother     History   Social History  . Marital Status: Widowed    Spouse Name: N/A    Number of Children: 3  . Years of Education: N/A   Occupational History  . 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 Smoking status: Former Games developer  . Smokeless tobacco: Never Used  . Alcohol Use: No  . Drug Use: No  . Sexually Active: Not on file   Other Topics Concern  . Not on file   Social History Narrative  . No narrative  on file   Review of Systems No chest pain No syncope occ feels palpitations---this has increased Appetite has been poor    Objective:   Physical Exam  Constitutional: She appears well-developed and well-nourished. No distress.  Neck: Normal range of motion. Neck supple.  Cardiovascular: Regular rhythm and normal heart sounds.  Exam reveals no gallop.   No murmur heard.      HR ~106 now   Pulmonary/Chest: Effort normal and breath sounds normal. No respiratory distress. She has no wheezes. She has no rales.  Musculoskeletal: Normal range of motion. She exhibits no tenderness.       Trace edema in ankles  Lymphadenopathy:    She has no cervical adenopathy.  Psychiatric: She has a normal mood and affect. Her behavior is normal. Judgment and thought content normal.          Assessment & Plan:

## 2011-01-01 ENCOUNTER — Encounter: Payer: Self-pay | Admitting: Internal Medicine

## 2011-01-01 ENCOUNTER — Ambulatory Visit (INDEPENDENT_AMBULATORY_CARE_PROVIDER_SITE_OTHER): Payer: Medicare Other | Admitting: Internal Medicine

## 2011-01-01 VITALS — BP 116/60 | HR 86 | Temp 98.1°F | Ht 63.0 in | Wt 116.0 lb

## 2011-01-01 DIAGNOSIS — I5022 Chronic systolic (congestive) heart failure: Secondary | ICD-10-CM

## 2011-01-01 DIAGNOSIS — I4891 Unspecified atrial fibrillation: Secondary | ICD-10-CM

## 2011-01-01 LAB — BASIC METABOLIC PANEL
GFR: 59.85 mL/min — ABNORMAL LOW (ref >60.00)
Glucose, Bld: 95 mg/dL (ref 70–99)
Potassium: 3.9 mEq/L (ref 3.5–5.1)
Sodium: 138 mEq/L (ref 135–145)

## 2011-01-01 NOTE — Assessment & Plan Note (Signed)
NYHA class 2 or slightly worse Stable Neutral fluid status Rate is better on digoxin--may still not have full effect yet  Will check labs Has appt with Dr Gwen Pounds coming up---would push that up if she worsens

## 2011-01-01 NOTE — Progress Notes (Signed)
  Subjective:    Patient ID: Rita Vasquez, female    DOB: 1916/06/15, 77 y.o.   MRN: [id number]  HPI Weight has come down 4# since the last visit Checks just about every day Notes DOE with everyday activities--making bed, taking shower, etc  No chest pain Gets palpitations at times---notices at rest No sig edema--but feet are puffy at times  Doesn't feel any different than 2 weeks ago  Current Outpatient Prescriptions on File Prior to Visit  Medication Sig Dispense Refill  . aspirin 81 MG tablet Take 81 mg by mouth daily.        . Calcium-Vitamin D 500-125 MG-UNIT TABS Take by mouth 2 (two) times daily.        . digoxin (LANOXIN) 0.125 MG tablet Take 1 tablet (125 mcg total) by mouth daily.  30 tablet  11  . furosemide (LASIX) 20 MG tablet Take 20 mg by mouth daily.       . polyethylene glycol (MIRALAX / GLYCOLAX) packet Take 17 g by mouth daily as needed.        . potassium chloride (KLOR-CON) 20 MEQ packet Take 20 mEq by mouth every other day.        . Vitamin D, Ergocalciferol, (DRISDOL) 50000 UNITS CAPS Take 50,000 Units by mouth every 30 (thirty) days.          No Known Allergies  Past Medical History  Diagnosis Date  . CHF (congestive heart failure)   . Atrial fibrillation   . Non Hodgkin's lymphoma   . Arthritis   . Pulmonary embolism   . Osteoporosis   . Parotid cyst     Past Surgical History  Procedure Date  . Abdominal hysterectomy   . Cataract extraction   . Appendectomy   . Breast biopsy   . Vertebroplasty   . Mohs surgery     Family History  Problem Relation Age of Onset  . Cancer Sister   . Cancer Brother     History   Social History  . Marital Status: Widowed    Spouse Name: N/A    Number of Children: 3  . Years of Education: N/A   Occupational History  . 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 Smoking status: Former Games developer  . Smokeless tobacco: Never Used  . Alcohol Use: No  . Drug Use: No    . Sexually Active: Not on file   Other Topics Concern  . Not on file   Social History Narrative  . No narrative on file   Review of Systems Appetite is poor but tries to eat Sleeps fair without PND    Objective:   Physical Exam  Constitutional: She appears well-developed and well-nourished. No distress.  Neck: Normal range of motion. Neck supple. No JVD present. No thyromegaly present.  Cardiovascular: Normal rate.  Exam reveals no gallop.   Murmur heard.      Irregular but rate ~84 today Soft systolic murmur  Pulmonary/Chest: Effort normal and breath sounds normal. No respiratory distress. She has no wheezes. She has no rales.  Musculoskeletal: She exhibits no edema and no tenderness.  Lymphadenopathy:    She has no cervical adenopathy.  Psychiatric: She has a normal mood and affect. Her behavior is normal. Judgment and thought content normal.          Assessment & Plan:

## 2011-01-01 NOTE — Assessment & Plan Note (Signed)
Rate control better back on the digoxin

## 2011-04-01 ENCOUNTER — Other Ambulatory Visit: Payer: Self-pay | Admitting: Internal Medicine

## 2011-04-03 ENCOUNTER — Encounter: Payer: Self-pay | Admitting: Internal Medicine

## 2011-04-03 ENCOUNTER — Ambulatory Visit (INDEPENDENT_AMBULATORY_CARE_PROVIDER_SITE_OTHER): Payer: 59 | Admitting: Internal Medicine

## 2011-04-03 VITALS — BP 94/54 | HR 70 | Temp 98.2°F | Ht 63.0 in | Wt 114.0 lb

## 2011-04-03 DIAGNOSIS — I959 Hypotension, unspecified: Secondary | ICD-10-CM

## 2011-04-03 DIAGNOSIS — Z23 Encounter for immunization: Secondary | ICD-10-CM

## 2011-04-03 DIAGNOSIS — M1990 Unspecified osteoarthritis, unspecified site: Secondary | ICD-10-CM

## 2011-04-03 DIAGNOSIS — I5022 Chronic systolic (congestive) heart failure: Secondary | ICD-10-CM

## 2011-04-03 DIAGNOSIS — I4891 Unspecified atrial fibrillation: Secondary | ICD-10-CM

## 2011-04-03 NOTE — Assessment & Plan Note (Signed)
good rate control on digoxin Continues on aspirin

## 2011-04-03 NOTE — Patient Instructions (Signed)
Please don't take the furosemide or potassium on days where your home weight is 116# or less

## 2011-04-03 NOTE — Progress Notes (Signed)
Subjective:    Patient ID: Rita Vasquez, female    DOB: 1916/06/17, 77 y.o.   MRN: [id number]  HPI Has not been doing great Has a lot of dizziness---esp when first getting up. Though it continues Checks BP weekly----systolic usually under 100  Takes the diuretic regularly Weight is stable  Walks regularly with walker Still gets DOE if bending over or lifting but can walk and do household tasks without dyspnea now  Recent visit to Dr Gwen Pounds No med changes Echo reportedly still shows "dysfunction"  occ palpitations---flutter feeling No pain  No sig arthritis pain Hasn't needed meds  Weight at home generally 115#  Current Outpatient Prescriptions on File Prior to Visit  Medication Sig Dispense Refill  . aspirin 81 MG tablet Take 81 mg by mouth daily.        . Calcium-Vitamin D 500-125 MG-UNIT TABS Take by mouth 2 (two) times daily.        . digoxin (LANOXIN) 0.125 MG tablet Take 1 tablet (125 mcg total) by mouth daily.  30 tablet  11  . furosemide (LASIX) 20 MG tablet Take 20 mg by mouth daily.       Marland Kitchen KLOR-CON M20 20 MEQ tablet TAKE 1 TABLET EVERY DAY  30 each  11  . polyethylene glycol (MIRALAX / GLYCOLAX) packet Take 17 g by mouth daily as needed.        . Vitamin D, Ergocalciferol, (DRISDOL) 50000 UNITS CAPS Take 50,000 Units by mouth every 30 (thirty) days.          No Known Allergies  Past Medical History  Diagnosis Date  . CHF (congestive heart failure)   . Atrial fibrillation   . Non Hodgkin's lymphoma   . Arthritis   . Pulmonary embolism   . Osteoporosis   . Parotid cyst     Past Surgical History  Procedure Date  . Abdominal hysterectomy   . Cataract extraction   . Appendectomy   . Breast biopsy   . Vertebroplasty   . Mohs surgery     Family History  Problem Relation Age of Onset  . Cancer Sister   . Cancer Brother     History   Social History  . Marital Status: Widowed    Spouse Name: N/A    Number of Children: 3  . Years of  Education: N/A   Occupational History  . 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 Smoking status: Former Games developer  . Smokeless tobacco: Never Used  . Alcohol Use: No  . Drug Use: No  . Sexually Active: Not on file   Other Topics Concern  . Not on file   Social History Narrative  . No narrative on file   Review of Systems Appetite is poor--has to make herself eat Uses ensure daily Sleeps okay Mood is okay--no sig depression     Objective:   Physical Exam  Constitutional: She appears well-developed and well-nourished. No distress.  Neck: Normal range of motion. Neck supple.  Cardiovascular: Normal rate and normal heart sounds.  Exam reveals no gallop.   No murmur heard.      irreg but seems regular in stretches Rate in 80's  Pulmonary/Chest: Effort normal and breath sounds normal. No respiratory distress. She has no wheezes. She has no rales.       No dullness  Musculoskeletal: She exhibits no tenderness.       Trace ankle edema on right  Lymphadenopathy:    She has no cervical adenopathy.  Psychiatric: She has a normal mood and affect. Her behavior is normal. Judgment and thought content normal.          Assessment & Plan:

## 2011-04-03 NOTE — Assessment & Plan Note (Signed)
Stable Will try to hold the furosemide if weight down

## 2011-04-03 NOTE — Assessment & Plan Note (Signed)
Fairly significant NYHA class 3? Now with dizziness so delicate balance with proper diuresis Will have her hold furosemide and potassium if weight 116# or under at home

## 2011-04-03 NOTE — Assessment & Plan Note (Signed)
Not an active problem now

## 2011-05-14 ENCOUNTER — Telehealth: Payer: Self-pay | Admitting: *Deleted

## 2011-05-14 NOTE — Telephone Encounter (Signed)
Pt's daughter is asking if pt can have her B12 level checked.  She is having some numbness in her fingers, and daughter thinks that could be the cause.  Also, daughter states that pt is more easily forgetfull and confused and she wanted you to know that as well.

## 2011-05-14 NOTE — Telephone Encounter (Signed)
It would be appropriate to check B12 levels but may need other evaluation as well If there are multiple concerns, I should do evaluation in the office and order labs that are indicated at that time

## 2011-05-15 NOTE — Telephone Encounter (Signed)
.  left message to have patient return my call.  

## 2011-05-16 ENCOUNTER — Telehealth: Payer: Self-pay | Admitting: *Deleted

## 2011-05-16 NOTE — Telephone Encounter (Signed)
She had a normal calcium level with labs in July I generally don't check vitamin D levels---since it is fat soluble the blood levels can be misleading and I recommend supplements even if it is normal We can discuss this further at her next OV and add the vitamin D level then if we decide to check it

## 2011-05-16 NOTE — Telephone Encounter (Signed)
Spoke with daughter and she will be scheduling appt for patient to see Dr.Letvak and to get labs

## 2011-05-16 NOTE — Telephone Encounter (Signed)
Pt wants labs to have vit d and calcium levels checked, she is currently on otc supplements.

## 2011-05-16 NOTE — Telephone Encounter (Signed)
Spoke with daughter and advised results, she will make an appointment for her mother.

## 2011-05-20 ENCOUNTER — Ambulatory Visit (INDEPENDENT_AMBULATORY_CARE_PROVIDER_SITE_OTHER): Payer: 59 | Admitting: Internal Medicine

## 2011-05-20 ENCOUNTER — Encounter: Payer: Self-pay | Admitting: Internal Medicine

## 2011-05-20 VITALS — BP 109/64 | HR 97 | Temp 98.5°F | Ht 63.0 in | Wt 116.0 lb

## 2011-05-20 DIAGNOSIS — R209 Unspecified disturbances of skin sensation: Secondary | ICD-10-CM

## 2011-05-20 LAB — HEPATIC FUNCTION PANEL
ALT: 16 U/L (ref 0–35)
AST: 24 U/L (ref 0–37)
Albumin: 4.2 g/dL (ref 3.5–5.2)
Alkaline Phosphatase: 30 U/L — ABNORMAL LOW (ref 39–117)
Total Protein: 6.8 g/dL (ref 6.0–8.3)

## 2011-05-20 LAB — CBC WITH DIFFERENTIAL/PLATELET
Basophils Relative: 0.3 % (ref 0.0–3.0)
Eosinophils Relative: 1.6 % (ref 0.0–5.0)
Lymphocytes Relative: 24.9 % (ref 12.0–46.0)
MCV: 98.9 fl (ref 78.0–100.0)
Monocytes Absolute: 0.5 10*3/uL (ref 0.1–1.0)
Neutrophils Relative %: 65.4 % (ref 43.0–77.0)
RBC: 4.02 Mil/uL (ref 3.87–5.11)
WBC: 6.3 10*3/uL (ref 4.5–10.5)

## 2011-05-20 LAB — BASIC METABOLIC PANEL
BUN: 13 mg/dL (ref 6–23)
CO2: 28 mEq/L (ref 19–32)
Chloride: 100 mEq/L (ref 96–112)
Glucose, Bld: 92 mg/dL (ref 70–99)
Potassium: 3.9 mEq/L (ref 3.5–5.1)

## 2011-05-20 NOTE — Progress Notes (Signed)
  Subjective:    Patient ID: Rita Vasquez, female    DOB: 05-Oct-1915, 77 y.o.   MRN: [id number]  HPI Has noted sensation problems in fingers over the past couple of weeks Feel numb Toes tingle No pain  General diet No vegetarian Appetite is poor---has to force herself to eat  No longer on fluid pill Breathing is about the same---DOE may be slightly quicker  Daughter also notes some decline in memory Some decrease in energy levels  Current Outpatient Prescriptions on File Prior to Visit  Medication Sig Dispense Refill  . aspirin 81 MG tablet Take 81 mg by mouth daily.        . Calcium-Vitamin D 500-125 MG-UNIT TABS Take by mouth 2 (two) times daily.          No Known Allergies  Past Medical History  Diagnosis Date  . CHF (congestive heart failure)   . Atrial fibrillation   . Non Hodgkin's lymphoma   . Arthritis   . Pulmonary embolism   . Osteoporosis   . Parotid cyst     Past Surgical History  Procedure Date  . Abdominal hysterectomy   . Cataract extraction   . Appendectomy   . Breast biopsy   . Vertebroplasty   . Mohs surgery     Family History  Problem Relation Age of Onset  . Cancer Sister   . Cancer Brother     History   Social History  . Marital Status: Widowed    Spouse Name: N/A    Number of Children: 3  . Years of Education: N/A   Occupational History  . 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 Smoking status: Former Games developer  . Smokeless tobacco: Never Used  . Alcohol Use: No  . Drug Use: No  . Sexually Active: Not on file   Other Topics Concern  . Not on file   Social History Narrative   Requests DNR--done 04/03/11   Review of Systems Sleeps okay  Weight fairly stable Some dizziness---even when sitting at times. No vertigo     Objective:   Physical Exam  Constitutional: She is oriented to person, place, and time.  Cardiovascular: Intact distal pulses.   Musculoskeletal:       No  hand synovitis Normal grip strength  Neurological: She is alert and oriented to person, place, and time.       President-- "Obama, ..." [id number] Recall  1/3          Assessment & Plan:

## 2011-05-20 NOTE — Assessment & Plan Note (Signed)
Relatively recent onset Will check labs including thyroid and b12

## 2011-06-03 ENCOUNTER — Ambulatory Visit: Payer: 59 | Admitting: Internal Medicine

## 2011-06-05 ENCOUNTER — Encounter: Payer: Self-pay | Admitting: Internal Medicine

## 2011-06-05 ENCOUNTER — Ambulatory Visit (INDEPENDENT_AMBULATORY_CARE_PROVIDER_SITE_OTHER): Payer: 59 | Admitting: Internal Medicine

## 2011-06-05 DIAGNOSIS — I4891 Unspecified atrial fibrillation: Secondary | ICD-10-CM

## 2011-06-05 DIAGNOSIS — I5023 Acute on chronic systolic (congestive) heart failure: Secondary | ICD-10-CM | POA: Insufficient documentation

## 2011-06-05 MED ORDER — METOPROLOL SUCCINATE ER 25 MG PO TB24
12.5000 mg | ORAL_TABLET | Freq: Every day | ORAL | Status: DC
Start: 2011-06-05 — End: 2011-06-12

## 2011-06-05 NOTE — Progress Notes (Signed)
  Subjective:    Patient ID: Geni Bers, female    DOB: 25-Jul-1915, 77 y.o.   MRN: [id number]  HPI Has started the vitamin B12  Here with daughter  Doesn't feel well Dizzy and SOB No energy Dizzy even at rest  Cough is back--like with heart Did start lasix again yesterday---weight was 120# Only weighing herself once a week  No chest pain occ gets racing feeling in chest---even at rest  Current Outpatient Prescriptions on File Prior to Visit  Medication Sig Dispense Refill  . aspirin 81 MG tablet Take 81 mg by mouth daily.        . Calcium-Vitamin D 500-125 MG-UNIT TABS Take by mouth 2 (two) times daily.        . Vitamin D, Ergocalciferol, (DRISDOL) 50000 UNITS CAPS Take 50,000 Units by mouth every 30 (thirty) days.          No Known Allergies  Past Medical History  Diagnosis Date  . CHF (congestive heart failure)   . Atrial fibrillation   . Non Hodgkin's lymphoma   . Arthritis   . Pulmonary embolism   . Osteoporosis   . Parotid cyst     Past Surgical History  Procedure Date  . Abdominal hysterectomy   . Cataract extraction   . Appendectomy   . Breast biopsy   . Vertebroplasty   . Mohs surgery     Family History  Problem Relation Age of Onset  . Cancer Sister   . Cancer Brother     History   Social History  . Marital Status: Widowed    Spouse Name: N/A    Number of Children: 3  . Years of Education: N/A   Occupational History  . 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 Smoking status: Former Games developer  . Smokeless tobacco: Never Used  . Alcohol Use: No  . Drug Use: No  . Sexually Active: Not on file   Other Topics Concern  . Not on file   Social History Narrative   Requests DNR--done 04/03/11   Review of Systems Appetite is poor--"I have to make myself eat" Sleeping okay Finger tips stay numb still    Objective:   Physical Exam  Constitutional: She appears well-developed and well-nourished.  No distress.  Neck: Normal range of motion. Neck supple.  Cardiovascular: Regular rhythm and normal heart sounds.  Exam reveals no gallop.   No murmur heard.      Actually sounds regular but rate ~120  Pulmonary/Chest: Effort normal and breath sounds normal. No respiratory distress. She has no wheezes. She has no rales.       No dullness  Musculoskeletal: She exhibits edema.       Trace pedal edema  Lymphadenopathy:    She has no cervical adenopathy.  Psychiatric: She has a normal mood and affect. Her behavior is normal.       Slightly anxious          Assessment & Plan:

## 2011-06-05 NOTE — Assessment & Plan Note (Signed)
Regular now but more tachycardic Will try low dose metoprolol

## 2011-06-05 NOTE — Patient Instructions (Signed)
Please weigh yourself daily and take the furosemide if you are over 115# Please start the metoprolol at 1/2 tab daily--first dose today

## 2011-06-05 NOTE — Assessment & Plan Note (Signed)
Has not been checking weight Discussed lasix based on weight Will start low dose metoprolol

## 2011-06-12 ENCOUNTER — Ambulatory Visit (INDEPENDENT_AMBULATORY_CARE_PROVIDER_SITE_OTHER): Payer: 59 | Admitting: Internal Medicine

## 2011-06-12 ENCOUNTER — Encounter: Payer: Self-pay | Admitting: Internal Medicine

## 2011-06-12 DIAGNOSIS — J209 Acute bronchitis, unspecified: Secondary | ICD-10-CM

## 2011-06-12 DIAGNOSIS — I5022 Chronic systolic (congestive) heart failure: Secondary | ICD-10-CM

## 2011-06-12 DIAGNOSIS — I4891 Unspecified atrial fibrillation: Secondary | ICD-10-CM

## 2011-06-12 MED ORDER — CARVEDILOL 6.25 MG PO TABS: 3.1250 mg | ORAL_TABLET | Freq: Two times a day (BID) | ORAL | Status: DC

## 2011-06-12 MED ORDER — AMOXICILLIN 500 MG PO TABS: 1000.0000 mg | ORAL_TABLET | Freq: Two times a day (BID) | ORAL | Status: AC

## 2011-06-12 NOTE — Assessment & Plan Note (Signed)
May be some better but still with high heart rate and Class 2-3 symptoms Will try carvedilol and if she can't tolerate that--will go back to digoxin

## 2011-06-12 NOTE — Patient Instructions (Signed)
Please try the carvedilol at 1/2 tab twice a day. Call if you cannot tolerate this as I will start the digoxin again Call if the respiratory symptoms aren't better by next week

## 2011-06-12 NOTE — Assessment & Plan Note (Signed)
May just be viral but with her heart status I am concerned about secondary bacterial infection Has been sick a week and has a lot of green mucus Will try empiric Rx with amoxil

## 2011-06-12 NOTE — Progress Notes (Signed)
  Subjective:    Patient ID: Rita Vasquez, female    DOB: 1916-03-07, 77 y.o.   MRN: [id number]  HPI 2 daughters are here today Feels better today but has head cold Stopped the metoprolol due to severe dizziness and SOB  Has been taking the furosemide Weighing daily ---~116#  Has rhinorrhea Has rattly cough--some better than initially Some thick yellow mucus No sore throat but is hoarse No fever Has been sick almost a week now  Breathing is about the same Has easy DOE--no worse than last week  Still has occ racing heart---brief (like seconds) No chest pain  Current Outpatient Prescriptions on File Prior to Visit  Medication Sig Dispense Refill  . aspirin 81 MG tablet Take 81 mg by mouth daily.        . Calcium-Vitamin D 500-125 MG-UNIT TABS Take by mouth 2 (two) times daily.        . furosemide (LASIX) 20 MG tablet Take 20 mg by mouth daily as needed. If home weight is over 115#       . Vitamin D, Ergocalciferol, (DRISDOL) 50000 UNITS CAPS Take 50,000 Units by mouth every 30 (thirty) days.          No Known Allergies  Past Medical History  Diagnosis Date  . CHF (congestive heart failure)   . Atrial fibrillation   . Non Hodgkin's lymphoma   . Arthritis   . Pulmonary embolism   . Osteoporosis   . Parotid cyst     Past Surgical History  Procedure Date  . Abdominal hysterectomy   . Cataract extraction   . Appendectomy   . Breast biopsy   . Vertebroplasty   . Mohs surgery     Family History  Problem Relation Age of Onset  . Cancer Sister   . Cancer Brother     History   Social History  . Marital Status: Widowed    Spouse Name: N/A    Number of Children: 3  . Years of Education: N/A   Occupational History  . 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 Smoking status: Former Games developer  . Smokeless tobacco: Never Used  . Alcohol Use: No  . Drug Use: No  . Sexually Active: Not on file   Other Topics Concern  .  Not on file   Social History Narrative   Requests DNR--done 04/03/11   Review of Systems Still has some edema in ankles Sleeping okay Appetite is poor --ongoing    Objective:   Physical Exam  Constitutional: She appears well-developed and well-nourished. No distress.  HENT:  Mouth/Throat: Oropharynx is clear and moist. No oropharyngeal exudate.       No sinus tenderness Moderate nasal congestion  Neck: Normal range of motion. Neck supple. No thyromegaly present.  Cardiovascular: Normal rate and normal heart sounds.  Exam reveals no gallop.   No murmur heard.      Irregular HR just about 100  Pulmonary/Chest: Effort normal and breath sounds normal. No respiratory distress. She has no wheezes. She has no rales.  Musculoskeletal: She exhibits edema.       Trace edema in ankles  Lymphadenopathy:    She has no cervical adenopathy.          Assessment & Plan:

## 2011-06-12 NOTE — Assessment & Plan Note (Signed)
Rate close to 100 On ASA Will try carvedilol

## 2011-06-14 ENCOUNTER — Telehealth: Payer: Self-pay | Admitting: Internal Medicine

## 2011-06-14 NOTE — Telephone Encounter (Signed)
Morrie Sheldon at [facility] sent email to Dr. Alphonsus Sias regarding medication Coreg. Dr. Alphonsus Sias responded via email to [place] [place]

## 2011-06-14 NOTE — Telephone Encounter (Signed)
Her BP was down and she felt off Told her to stop if not feeling better by the end of the weekend (and then we will restart the dig)

## 2011-06-17 ENCOUNTER — Telehealth: Payer: Self-pay | Admitting: *Deleted

## 2011-06-17 MED ORDER — DIGOXIN 125 MCG PO TABS: 125.0000 ug | ORAL_TABLET | Freq: Every day | ORAL | Status: DC

## 2011-06-17 NOTE — Telephone Encounter (Signed)
Spoke with patient and advised results, she understood 

## 2011-06-17 NOTE — Telephone Encounter (Signed)
Rita Vasquez @ [facility] sent Dr.Letvak a email to give him a update on the pt, she went out to check her BP and she was still having some weakness and her BP was 104/62 p 100, per pt she had stopped taking the Coreg over the weekend since she was having considerable weakness and she says that she feels some better today but not back to normal.  Per verbal from Dr.Letvak stop the Coreg and add to allergy list as intolerance. Restart the digoxin 0.125mg  daily and send in refill for #30 with 11rf's and we will check her BP at her f/u appt.

## 2011-06-17 NOTE — Telephone Encounter (Signed)
Will check the digoxin level at next appt also Had tolerated this dose in the past

## 2011-06-24 ENCOUNTER — Ambulatory Visit (INDEPENDENT_AMBULATORY_CARE_PROVIDER_SITE_OTHER): Payer: 59 | Admitting: Family Medicine

## 2011-06-24 ENCOUNTER — Encounter: Payer: Self-pay | Admitting: Family Medicine

## 2011-06-24 DIAGNOSIS — R0602 Shortness of breath: Secondary | ICD-10-CM

## 2011-06-24 DIAGNOSIS — Z79899 Other long term (current) drug therapy: Secondary | ICD-10-CM

## 2011-06-24 DIAGNOSIS — R609 Edema, unspecified: Secondary | ICD-10-CM

## 2011-06-24 DIAGNOSIS — I5022 Chronic systolic (congestive) heart failure: Secondary | ICD-10-CM

## 2011-06-24 LAB — BASIC METABOLIC PANEL
Calcium: 9.5 mg/dL (ref 8.4–10.5)
Creatinine, Ser: 0.9 mg/dL (ref 0.4–1.2)

## 2011-06-24 LAB — BRAIN NATRIURETIC PEPTIDE: Pro B Natriuretic peptide (BNP): 3069 pg/mL — ABNORMAL HIGH (ref 0.0–100.0)

## 2011-06-24 NOTE — Progress Notes (Signed)
Dizzy.  Longstanding, usually worse with walking.  "Likey my head's going around."  Now with sx while sitting.  Started 2 days ago.  Similar to prev sx, except now with sitting.    She had some edema and weight gain in last week, started back on lasix.  Baseline weight ~115lbs, 119 today.  She feels slightly sob over the last few days.  No CP.  No speech changes, no focal motor changes.  She feels diffusely weak.  [place], [place].  She has an alarm she carries in case of falls.  She doesn't have presyncopal sx.    Meds, vitals, and allergies reviewed.   ROS: See HPI.  Otherwise, noncontributory.  Nad, no inc in wob Mmm Neck supple ctab  IRR, not tachy abd soft, not ttp Ext with trace edema CN 2-12 wnl B, S/S/DTR wnl x4

## 2011-06-24 NOTE — Patient Instructions (Signed)
You can get your results through our phone system.  Follow the instructions on the blue card. I would continue your meds as usual except for taking 2 doses of lasix today and tomorrow.  If you don't start to feel some better, call us with an update.

## 2011-06-25 LAB — DIGOXIN LEVEL: Digoxin Level: 1 ng/mL (ref 0.8–2.0)

## 2011-06-25 NOTE — Assessment & Plan Note (Addendum)
BNP is elevated. I don't know if this is causing the 'head spinning' sx, but she has more edema and inc in weight.  Inc lasix.  BNP is elevated.  Continue inc dose of lasix- 40mg  a day- until f/u or until weight 115 lbs or lower. Nontoxic, no inc in wob.  Okay for outpatient f/u.  She agreed with plan. >25 min spent with face to face with patient, >50% counseling and/or coordinating care.  I have discussed the case with her PMD.

## 2011-06-27 ENCOUNTER — Telehealth: Payer: Self-pay | Admitting: Internal Medicine

## 2011-06-27 NOTE — Telephone Encounter (Signed)
I called pt after I talked with Dr. Alphonsus Sias.  SOB with walking but not when laying down.  Weight is down to 116 lbs.  Edema is about the same per patient.  She has been taking extra lasix as instructed.  No fevers.  She doesn't feel so SOB that she would need to be at the ER.  We talked about options.  She agrees to come into clinic tomorrow and we can recheck her.  We can consider cards eval at that point, depending on her condition.  She'll continue on 2 lasix a day until eval tomorrow.    Please get patient added onto the schedule for either me or Dr. Alphonsus Sias tomorrow and notify pt.  Please notify Dr. Alphonsus Sias a [place] Childes.  Thanks.

## 2011-06-27 NOTE — Telephone Encounter (Signed)
Patient coming in at 3:15 on 06/28/11

## 2011-06-27 NOTE — Telephone Encounter (Signed)
Patient called this morning and stated she is still SOB her breathing is labored, dizzy and weak and it's hard for her to walk.  She was seen on Monday by you just wanted to get your advice.

## 2011-06-28 ENCOUNTER — Encounter: Payer: Self-pay | Admitting: Internal Medicine

## 2011-06-28 ENCOUNTER — Ambulatory Visit (INDEPENDENT_AMBULATORY_CARE_PROVIDER_SITE_OTHER): Payer: 59 | Admitting: Internal Medicine

## 2011-06-28 DIAGNOSIS — I5023 Acute on chronic systolic (congestive) heart failure: Secondary | ICD-10-CM

## 2011-06-28 NOTE — Progress Notes (Signed)
  Subjective:    Patient ID: Rita Vasquez, female    DOB: 1916-05-01, 78 y.o.   MRN: [id number]  HPI Has had ongoing dizziness---even when sitting down Some SOB---even without exertion Has been worse over the past couple of days  No strength Daughter does shopping but she cooks herself Able to do this without a problem Now getting help with shower now Dresses and uses bathroom ---will get some DOE with these but manages  No chest pain Rare palpitations---very brief Some right ankle swelling  Current Outpatient Prescriptions on File Prior to Visit  Medication Sig Dispense Refill  . aspirin 81 MG tablet Take 81 mg by mouth daily.        . Calcium-Vitamin D 500-125 MG-UNIT TABS Take by mouth 2 (two) times daily.        . digoxin (LANOXIN) 0.125 MG tablet Take 1 tablet (125 mcg total) by mouth daily.  30 tablet  11  . furosemide (LASIX) 20 MG tablet Take 20 mg by mouth daily as needed. If home weight is over 115#       . vitamin B-12 (CYANOCOBALAMIN) 1000 MCG tablet Take 1,000 mcg by mouth daily.        . Vitamin D, Ergocalciferol, (DRISDOL) 50000 UNITS CAPS Take 50,000 Units by mouth every 30 (thirty) days.          Allergies  Allergen Reactions  . Coreg     Couldn't tolerate    Past Medical History  Diagnosis Date  . CHF (congestive heart failure)   . Atrial fibrillation   . Non Hodgkin's lymphoma   . Arthritis   . Pulmonary embolism   . Osteoporosis   . Parotid cyst     Past Surgical History  Procedure Date  . Abdominal hysterectomy   . Cataract extraction   . Appendectomy   . Breast biopsy   . Vertebroplasty   . Mohs surgery     Family History  Problem Relation Age of Onset  . Cancer Sister   . Cancer Brother     History   Social History  . Marital Status: Widowed    Spouse Name: N/A    Number of Children: 3  . Years of Education: N/A   Occupational History  . 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  Smoking status: Former Games developer  . Smokeless tobacco: Never Used  . Alcohol Use: No  . Drug Use: No  . Sexually Active: Not on file   Other Topics Concern  . Not on file   Social History Narrative   Requests DNR--done 04/03/11   Review of Systems Weight is down again--she weighs daily Appetite isn't great    Objective:   Physical Exam  Constitutional: She appears well-developed and well-nourished. No distress.  Neck: Normal range of motion. Neck supple.  Cardiovascular: Regular rhythm and normal heart sounds.  Exam reveals no gallop.   No murmur heard.      Slight tachycardia  Pulmonary/Chest: Effort normal. No respiratory distress. She has no wheezes. She has no rales.  Musculoskeletal:       Trace ankle edema  Lymphadenopathy:    She has no cervical adenopathy.  Psychiatric: She has a normal mood and affect. Her behavior is normal. Judgment and thought content normal.          Assessment & Plan:

## 2011-06-28 NOTE — Patient Instructions (Signed)
Please continue the furosemide once a day unless your weight goes up again Please call Dr Philemon Kingdom office to arrange an appt next week

## 2011-06-28 NOTE — Assessment & Plan Note (Signed)
Recent BNP ~3000 Heart rate still up despite digoxin level ~1 Doesn't seem to be fluid overloaded now Has been intolerant of multiple beta blockers CCBs not a good idea with low EF Might need trial with ACEI---she thinks she may have been on lisinopril in past Will have her get back in with Dr Gwen Pounds by next week  Getting more help at home May want to consider assisted living

## 2011-07-10 ENCOUNTER — Ambulatory Visit: Payer: 59 | Admitting: Internal Medicine

## 2011-07-11 ENCOUNTER — Ambulatory Visit: Payer: 59 | Admitting: Internal Medicine

## 2011-07-15 ENCOUNTER — Encounter: Payer: Self-pay | Admitting: Internal Medicine

## 2011-07-15 ENCOUNTER — Ambulatory Visit (INDEPENDENT_AMBULATORY_CARE_PROVIDER_SITE_OTHER): Payer: 59 | Admitting: Internal Medicine

## 2011-07-15 VITALS — BP 110/68 | HR 68 | Temp 97.9°F | Ht 63.0 in | Wt 115.0 lb

## 2011-07-15 DIAGNOSIS — I4891 Unspecified atrial fibrillation: Secondary | ICD-10-CM

## 2011-07-15 DIAGNOSIS — M6281 Muscle weakness (generalized): Secondary | ICD-10-CM

## 2011-07-15 DIAGNOSIS — I5022 Chronic systolic (congestive) heart failure: Secondary | ICD-10-CM

## 2011-07-15 DIAGNOSIS — R29898 Other symptoms and signs involving the musculoskeletal system: Secondary | ICD-10-CM

## 2011-07-15 NOTE — Progress Notes (Signed)
Subjective:    Patient ID: Rita Vasquez, female    DOB: March 07, 1916, 78 y.o.   MRN: [id number]  HPI Not doing that well Still with dizziness and SOB---feels it is some worse in past few days Feels her swelling is worse Having troubles with balance  Has appt with Dr Gwen Pounds this afternoon---earlier appt cancelled due to weather  No chest pain occ feels palpitations---brief  Sleeps with 2 pillows Awakens a couple of times due to dyspnea---sits up briefly, then able to get back to sleep  Has aide twice a week to help with shower Has help for housework Daughter does shopping (or aide)---she cooks herself Still straightens up and does laundry  Hasn't needed lasix much Takes if weight over 115# at home  Current Outpatient Prescriptions on File Prior to Visit  Medication Sig Dispense Refill  . aspirin 81 MG tablet Take 81 mg by mouth daily.        . Calcium-Vitamin D 500-125 MG-UNIT TABS Take by mouth 2 (two) times daily.        . digoxin (LANOXIN) 0.125 MG tablet Take 1 tablet (125 mcg total) by mouth daily.  30 tablet  11  . furosemide (LASIX) 20 MG tablet Take 20 mg by mouth daily as needed. If home weight is over 115#       . vitamin B-12 (CYANOCOBALAMIN) 1000 MCG tablet Take 1,000 mcg by mouth daily.        . Vitamin D, Ergocalciferol, (DRISDOL) 50000 UNITS CAPS Take 50,000 Units by mouth every 30 (thirty) days.          Allergies  Allergen Reactions  . Coreg     Couldn't tolerate    Past Medical History  Diagnosis Date  . CHF (congestive heart failure)   . Atrial fibrillation   . Non Hodgkin's lymphoma   . Arthritis   . Pulmonary embolism   . Osteoporosis   . Parotid cyst     Past Surgical History  Procedure Date  . Abdominal hysterectomy   . Cataract extraction   . Appendectomy   . Breast biopsy   . Vertebroplasty   . Mohs surgery     Family History  Problem Relation Age of Onset  . Cancer Sister   . Cancer Brother     History   Social  History  . Marital Status: Widowed    Spouse Name: N/A    Number of Children: 3  . Years of Education: N/A   Occupational History  . 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 Smoking status: Former Games developer  . Smokeless tobacco: Never Used  . Alcohol Use: No  . Drug Use: No  . Sexually Active: Not on file   Other Topics Concern  . Not on file   Social History Narrative   Requests DNR--done 04/03/11   Review of Systems Appetite is poor Weight is stable    Objective:   Physical Exam  Constitutional: She appears well-developed. No distress.  Neck: Normal range of motion. Neck supple. No thyromegaly present.  Cardiovascular: Normal rate.  Exam reveals no gallop.        Irregular Rate down ~70 Soft systolic murmur  Pulmonary/Chest: Effort normal and breath sounds normal. No respiratory distress. She has no wheezes. She has no rales.  Musculoskeletal: She exhibits no edema.  Psychiatric: Her behavior is normal.       frustrated  Assessment & Plan:

## 2011-07-15 NOTE — Assessment & Plan Note (Signed)
Ongoing poor functional status Probably NYHA class 3 Intolerant of beta blockers but rate better now on digoxin Probably needs trial again of ACEI----seeing Dr Gwen Pounds today. Was on in past, not sure why stopped

## 2011-07-15 NOTE — Assessment & Plan Note (Signed)
Doesn't consider arthritis a problem Does have leg weakness with walking that is probably related to CHF

## 2011-07-15 NOTE — Assessment & Plan Note (Signed)
Rate is better now On aspirin instead of coumadin (??due to fall risk) Will defer this decision to Dr Gwen Pounds

## 2011-07-15 NOTE — Patient Instructions (Signed)
Please consider moving to Assisted living

## 2011-07-16 DIAGNOSIS — Z0279 Encounter for issue of other medical certificate: Secondary | ICD-10-CM

## 2011-08-08 ENCOUNTER — Non-Acute Institutional Stay: Payer: 59 | Admitting: Internal Medicine

## 2011-08-08 ENCOUNTER — Encounter: Payer: Self-pay | Admitting: Internal Medicine

## 2011-08-08 VITALS — BP 100/60 | HR 69 | Temp 97.3°F | Resp 16 | Wt 114.0 lb

## 2011-08-08 DIAGNOSIS — M810 Age-related osteoporosis without current pathological fracture: Secondary | ICD-10-CM

## 2011-08-08 DIAGNOSIS — I4891 Unspecified atrial fibrillation: Secondary | ICD-10-CM

## 2011-08-08 DIAGNOSIS — I5022 Chronic systolic (congestive) heart failure: Secondary | ICD-10-CM

## 2011-08-08 NOTE — Assessment & Plan Note (Signed)
On calcium and vitamin D

## 2011-08-08 NOTE — Progress Notes (Signed)
Subjective:    Patient ID: Rita Vasquez, female    DOB: December 18, 1915, 78 y.o.   MRN: [id number]  HPI Moved to assisted living about 1 week ago (or more) Seems to have adjusted well Now accepts that this is proper setting for her Has help for shower---due to dizziness and SOB Dresses and bathroom are independent Walks down to dining room with walker  Still has edema--tries to keep them elevated Dr Gwen Pounds changed lasix parameter to taking if weight >113# No chest pain Sleeps on 2 pillows---stable No PND Only regular daily activity that causes SOB is showering Occ gets a palpitation---very brief  Not really depressed Not anhedonic---enjoys being with other people at meals. Hasn't gotten involved in any activities as yet (not overly interested)  Current Outpatient Prescriptions on File Prior to Visit  Medication Sig Dispense Refill  . aspirin 81 MG tablet Take 81 mg by mouth daily.        . Calcium-Vitamin D 500-125 MG-UNIT TABS Take by mouth 2 (two) times daily.        . digoxin (LANOXIN) 0.125 MG tablet Take 1 tablet (125 mcg total) by mouth daily.  30 tablet  11  . furosemide (LASIX) 20 MG tablet Take 20 mg by mouth daily as needed. If home weight is over 113#      . vitamin B-12 (CYANOCOBALAMIN) 1000 MCG tablet Take 1,000 mcg by mouth daily.        . Vitamin D, Ergocalciferol, (DRISDOL) 50000 UNITS CAPS Take 50,000 Units by mouth every 30 (thirty) days.          Allergies  Allergen Reactions  . Coreg     Couldn't tolerate    Past Medical History  Diagnosis Date  . CHF (congestive heart failure)   . Atrial fibrillation   . Non Hodgkin's lymphoma   . Arthritis   . Pulmonary embolism   . Osteoporosis   . Parotid cyst     Past Surgical History  Procedure Date  . Abdominal hysterectomy   . Cataract extraction   . Appendectomy   . Breast biopsy   . Vertebroplasty   . Mohs surgery     Family History  Problem Relation Age of Onset  . Cancer Sister   .  Cancer Brother     History   Social History  . Marital Status: Widowed    Spouse Name: N/A    Number of Children: 3  . Years of Education: N/A   Occupational History  . 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 Smoking status: Former Games developer  . Smokeless tobacco: Never Used  . Alcohol Use: No  . Drug Use: No  . Sexually Active: Not on file   Other Topics Concern  . Not on file   Social History Narrative   Requests DNR--done 04/03/11   Review of Systems Weighing daily appetite is still not good---food is okay Sleeps okay     Objective:   Physical Exam  Constitutional: She appears well-developed and well-nourished. No distress.  Neck: Normal range of motion. Neck supple.  Cardiovascular: Normal rate.  Exam reveals no gallop.   No murmur heard.      Irregular but rate controlled  Pulmonary/Chest: Effort normal and breath sounds normal. No respiratory distress. She has no wheezes. She has no rales.  Abdominal: Soft. There is no tenderness.  Musculoskeletal: She exhibits edema.       1+ pitting edema  in calves/feet  Lymphadenopathy:    She has no cervical adenopathy.  Skin:       No leg or foot ulcers  Psychiatric: She has a normal mood and affect. Her behavior is normal. Judgment and thought content normal.          Assessment & Plan:

## 2011-08-08 NOTE — Assessment & Plan Note (Signed)
Good rate control on the digoxin now ASA only due to bleeding risks

## 2011-08-08 NOTE — Assessment & Plan Note (Signed)
Improved since moving to assisted living and having more support Still on the lasix prn --guided by weight Discussed elevation for legs (she has trouble with support hose)

## 2011-08-09 ENCOUNTER — Ambulatory Visit: Payer: 59 | Admitting: Internal Medicine

## 2011-10-10 ENCOUNTER — Non-Acute Institutional Stay: Payer: 59 | Admitting: Internal Medicine

## 2011-10-10 ENCOUNTER — Encounter: Payer: Self-pay | Admitting: Internal Medicine

## 2011-10-10 VITALS — BP 120/64 | HR 70 | Temp 97.6°F | Resp 22 | Wt 110.0 lb

## 2011-10-10 DIAGNOSIS — I4891 Unspecified atrial fibrillation: Secondary | ICD-10-CM

## 2011-10-10 DIAGNOSIS — M810 Age-related osteoporosis without current pathological fracture: Secondary | ICD-10-CM

## 2011-10-10 DIAGNOSIS — I5022 Chronic systolic (congestive) heart failure: Secondary | ICD-10-CM

## 2011-10-10 NOTE — Assessment & Plan Note (Signed)
On calcium and vitamin D Discussed trying regular walking schedule---may improve overall stamina and would help bones

## 2011-10-10 NOTE — Assessment & Plan Note (Signed)
Rate is better now Regularly under 100 on the digoxin Seems to have helped functional status (though still greatly limited)

## 2011-10-10 NOTE — Progress Notes (Signed)
Subjective:    Patient ID: Rita Vasquez, female    DOB: 1915-08-16, 78 y.o.   MRN: [id number]  HPI Reviewed status with Albin Felling RN Has stabilized per her  Resident really happy she is here Realizes she needed the extra help  Still has chronic dyspnea Occ notes even at rest Occ non exertional chest pain Heart races at times---very brief. May cause some SOB when it occurs Swelling in ankles has been better---over 2 weeks now Has been monitoring weight daily--- 108-111  No syncope but regular dizzy feelings Occ when sitting but mostly when she is up Occ has to sit down ---usually uses rollator walker(encouraged her to use just this one)  Walks downstairs to meals with walker No falls No arthritis pain  Current Outpatient Prescriptions on File Prior to Visit  Medication Sig Dispense Refill  . aspirin 81 MG tablet Take 81 mg by mouth daily.        . Calcium-Vitamin D 500-125 MG-UNIT TABS Take by mouth 2 (two) times daily.        . digoxin (LANOXIN) 0.125 MG tablet Take 1 tablet (125 mcg total) by mouth daily.  30 tablet  11  . furosemide (LASIX) 20 MG tablet Take 20 mg by mouth daily as needed. If home weight is over 113#      . vitamin B-12 (CYANOCOBALAMIN) 1000 MCG tablet Take 1,000 mcg by mouth daily.        . Vitamin D, Ergocalciferol, (DRISDOL) 50000 UNITS CAPS Take 50,000 Units by mouth every 30 (thirty) days.          Allergies  Allergen Reactions  . Coreg     Couldn't tolerate    Past Medical History  Diagnosis Date  . CHF (congestive heart failure)   . Atrial fibrillation   . Non Hodgkin's lymphoma   . Arthritis   . Pulmonary embolism   . Osteoporosis   . Parotid cyst     Past Surgical History  Procedure Date  . Abdominal hysterectomy   . Cataract extraction   . Appendectomy   . Breast biopsy   . Vertebroplasty   . Mohs surgery     Family History  Problem Relation Age of Onset  . Cancer Sister   . Cancer Brother     History   Social  History  . Marital Status: Widowed    Spouse Name: N/A    Number of Children: 3  . Years of Education: N/A   Occupational History  . 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 Smoking status: Former Games developer  . Smokeless tobacco: Never Used  . Alcohol Use: No  . Drug Use: No  . Sexually Active: Not on file   Other Topics Concern  . Not on file   Social History Narrative   Requests DNR--done 04/03/11   Review of Systems appetite is not great---fills up quickly Discussed that she should have snacks Uses 2 boost a day Sleeps well--still 2 pillows. NO PND. Nocturia x 2-3 and this is stable     Objective:   Physical Exam  Constitutional: She appears well-developed and well-nourished. No distress.  Neck: Normal range of motion. Neck supple.  Cardiovascular: Normal rate and normal heart sounds.  Exam reveals no gallop.   No murmur heard.      irregular  Pulmonary/Chest: Effort normal and breath sounds normal. No respiratory distress. She has no wheezes. She has no rales.  Abdominal:  Soft. There is no tenderness.  Musculoskeletal: She exhibits no edema and no tenderness.  Lymphadenopathy:    She has no cervical adenopathy.  Psychiatric: She has a normal mood and affect. Her behavior is normal.          Assessment & Plan:

## 2011-10-10 NOTE — Assessment & Plan Note (Signed)
Has reached steady state now Weight stable on daily low dose lasix May be related to her occ dizziness---but probably best to continue daily lasix

## 2012-01-02 ENCOUNTER — Non-Acute Institutional Stay: Payer: 59 | Admitting: Internal Medicine

## 2012-01-02 ENCOUNTER — Encounter: Payer: Self-pay | Admitting: Internal Medicine

## 2012-01-02 VITALS — BP 110/60 | HR 80 | Temp 97.3°F | Resp 24 | Wt 115.0 lb

## 2012-01-02 DIAGNOSIS — M810 Age-related osteoporosis without current pathological fracture: Secondary | ICD-10-CM

## 2012-01-02 DIAGNOSIS — I5022 Chronic systolic (congestive) heart failure: Secondary | ICD-10-CM

## 2012-01-02 DIAGNOSIS — I4891 Unspecified atrial fibrillation: Secondary | ICD-10-CM

## 2012-01-02 NOTE — Assessment & Plan Note (Signed)
Regular rhythm today On dig and ASA (due to fall risk)

## 2012-01-02 NOTE — Progress Notes (Signed)
Subjective:    Patient ID: Rita Vasquez, female    DOB: 06/24/1915, 78 y.o.   MRN: [id number]  HPI Reviewed care with Albin Felling RN  Has noticed some worsening of dizziness and SOB Still tries to walk bid  Gets weights daily Recently had lasix increased to 20 bid by Dr Gwen Pounds  Feet have gotten more bluish No pain though Mild edema No chest pain Has noticed some palpitations at night at times--no dizziness with this  Has noted some change in exercise tolerance in the past couple of months Uses rollator so she can rest during her walks  Still satisfied here  Generally happy  Current Outpatient Prescriptions on File Prior to Visit  Medication Sig Dispense Refill  . aspirin 81 MG tablet Take 81 mg by mouth daily.        . Calcium-Vitamin D 500-125 MG-UNIT TABS Take by mouth 2 (two) times daily.        . digoxin (LANOXIN) 0.125 MG tablet Take 1 tablet (125 mcg total) by mouth daily.  30 tablet  11  . furosemide (LASIX) 20 MG tablet Take 20 mg by mouth daily as needed. If home weight is over 113#      . vitamin B-12 (CYANOCOBALAMIN) 1000 MCG tablet Take 1,000 mcg by mouth daily.        . Vitamin D, Ergocalciferol, (DRISDOL) 50000 UNITS CAPS Take 50,000 Units by mouth every 30 (thirty) days.          Allergies  Allergen Reactions  . Coreg     Couldn't tolerate    Past Medical History  Diagnosis Date  . CHF (congestive heart failure)   . Atrial fibrillation   . Non Hodgkin's lymphoma   . Arthritis   . Pulmonary embolism   . Osteoporosis   . Parotid cyst     Past Surgical History  Procedure Date  . Abdominal hysterectomy   . Cataract extraction   . Appendectomy   . Breast biopsy   . Vertebroplasty   . Mohs surgery     Family History  Problem Relation Age of Onset  . Cancer Sister   . Cancer Brother     History   Social History  . Marital Status: Widowed    Spouse Name: N/A    Number of Children: 3  . Years of Education: N/A   Occupational  History  . 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 Smoking status: Former Games developer  . Smokeless tobacco: Never Used  . Alcohol Use: No  . Drug Use: No  . Sexually Active: Not on file   Other Topics Concern  . Not on file   Social History Narrative   Requests DNR--done 04/03/11   Review of Systems Appetite is poor. She supplements with daily ensure Sleeps okay. Stable with 2 pillows and no PND Nocturia x 3--- no recent change Bowels have been okay     Objective:   Physical Exam  Constitutional: She appears well-developed and well-nourished. No distress.  Neck: Normal range of motion. Neck supple. No thyromegaly present.  Cardiovascular: Normal rate, regular rhythm and intact distal pulses.  Exam reveals no gallop.   Murmur heard.      Regular now! Very soft systolic murmur along left sternal border  Pulmonary/Chest: Effort normal and breath sounds normal. No respiratory distress. She has no wheezes. She has no rales.  Abdominal: Soft. There is no tenderness.  Musculoskeletal: She exhibits  edema. She exhibits no tenderness.       Trace edema Venous stasis changes in feet but pulses palpable  Lymphadenopathy:    She has no cervical adenopathy.  Skin:       No foot or leg ulcers  Psychiatric: She has a normal mood and affect. Her behavior is normal.          Assessment & Plan:

## 2012-01-02 NOTE — Assessment & Plan Note (Signed)
On vitamin d and does walk daily

## 2012-01-02 NOTE — Assessment & Plan Note (Signed)
Stable or slight decline in exercise tolerance Weighed daily and diuretics adjusted as needed---but she is very susceptible to overdiuresis No changes for now

## 2012-01-29 ENCOUNTER — Ambulatory Visit (INDEPENDENT_AMBULATORY_CARE_PROVIDER_SITE_OTHER): Payer: 59 | Admitting: Internal Medicine

## 2012-01-29 ENCOUNTER — Encounter: Payer: Self-pay | Admitting: Internal Medicine

## 2012-01-29 VITALS — BP 120/70 | HR 87 | Temp 97.9°F | Ht 63.0 in | Wt 116.0 lb

## 2012-01-29 DIAGNOSIS — I4891 Unspecified atrial fibrillation: Secondary | ICD-10-CM

## 2012-01-29 DIAGNOSIS — I5022 Chronic systolic (congestive) heart failure: Secondary | ICD-10-CM

## 2012-01-29 DIAGNOSIS — R51 Headache: Secondary | ICD-10-CM

## 2012-01-29 LAB — HEPATIC FUNCTION PANEL
Bilirubin, Direct: 0.1 mg/dL (ref 0.0–0.3)
Total Bilirubin: 0.6 mg/dL (ref 0.3–1.2)

## 2012-01-29 LAB — CBC WITH DIFFERENTIAL/PLATELET
Basophils Absolute: 0 10*3/uL (ref 0.0–0.1)
Eosinophils Absolute: 0.1 10*3/uL (ref 0.0–0.7)
Lymphocytes Relative: 20.7 % (ref 12.0–46.0)
MCHC: 33 g/dL (ref 30.0–36.0)
Neutrophils Relative %: 69.7 % (ref 43.0–77.0)
RBC: 4.42 Mil/uL (ref 3.87–5.11)
RDW: 13.4 % (ref 11.5–14.6)

## 2012-01-29 LAB — BASIC METABOLIC PANEL
CO2: 31 mEq/L (ref 19–32)
Calcium: 10.3 mg/dL (ref 8.4–10.5)
Creatinine, Ser: 0.7 mg/dL (ref 0.4–1.2)
Glucose, Bld: 95 mg/dL (ref 70–99)

## 2012-01-29 NOTE — Assessment & Plan Note (Signed)
NYHA class 3-4 symptoms Despite normal 02 sats---may be worth trial of oxygen Doesn't qualify but she will pay herself

## 2012-01-29 NOTE — Assessment & Plan Note (Signed)
Seems regular now Hasn't tolerated beta blockers Tachycardia still--worse with walking. Will try oxygen

## 2012-01-29 NOTE — Progress Notes (Signed)
Subjective:    Patient ID: Rita Vasquez, female    DOB: 08/01/1915, 78 y.o.   MRN: [id number]  HPI Here with daughter Having more shortness of breath Even just sitting Tries to walk but has to rest after a couple of steps No strength  Has had dizziness and constant headache for a week or two Tylenol no help Diffuse dull achy constant headache. Comes on when sitting or walking Dizziness has been chronic but worse now  No chest pain Does feel some racing heart at times---may last just a minute or so Some edema--variable Taking the furosemide daily again Current Outpatient Prescriptions on File Prior to Visit  Medication Sig Dispense Refill  . aspirin 81 MG tablet Take 81 mg by mouth daily.        . Calcium-Vitamin D 500-125 MG-UNIT TABS Take by mouth 2 (two) times daily.        . digoxin (LANOXIN) 0.125 MG tablet Take 1 tablet (125 mcg total) by mouth daily.  30 tablet  11  . furosemide (LASIX) 20 MG tablet Take 20 mg by mouth daily as needed. If home weight is over 113#      . vitamin B-12 (CYANOCOBALAMIN) 1000 MCG tablet Take 1,000 mcg by mouth daily.        . Vitamin D, Ergocalciferol, (DRISDOL) 50000 UNITS CAPS Take 50,000 Units by mouth every 30 (thirty) days.          Allergies  Allergen Reactions  . Coreg     Couldn't tolerate    Past Medical History  Diagnosis Date  . CHF (congestive heart failure)   . Atrial fibrillation   . Non Hodgkin's lymphoma   . Arthritis   . Pulmonary embolism   . Osteoporosis   . Parotid cyst     Past Surgical History  Procedure Date  . Abdominal hysterectomy   . Cataract extraction   . Appendectomy   . Breast biopsy   . Vertebroplasty   . Mohs surgery     Family History  Problem Relation Age of Onset  . Cancer Sister   . Cancer Brother     History   Social History  . Marital Status: Widowed    Spouse Name: N/A    Number of Children: 3  . Years of Education: N/A   Occupational History  . 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 Smoking status: Former Games developer  . Smokeless tobacco: Never Used  . Alcohol Use: No  . Drug Use: No  . Sexually Active: Not on file   Other Topics Concern  . Not on file   Social History Narrative   Requests DNR--done 04/03/11   Review of Systems Appetite is poor Sleeps okay Uses 2 pillows still--no PND    Objective:   Physical Exam  Constitutional: She appears well-developed. No distress.  Neck: Normal range of motion. Neck supple.  Cardiovascular: Normal rate, regular rhythm and normal heart sounds.  Exam reveals no gallop.   No murmur heard.      Heart seems regular now  Pulmonary/Chest: Effort normal and breath sounds normal. No respiratory distress. She has no wheezes. She has no rales.  Abdominal: Soft. There is no tenderness.  Musculoskeletal: She exhibits no edema and no tenderness.  Lymphadenopathy:    She has no cervical adenopathy.  Psychiatric: She has a normal mood and affect. Her behavior is normal.  Assessment & Plan:

## 2012-01-29 NOTE — Assessment & Plan Note (Signed)
Non specific Nothing to suggest increased ICP No neck tightness, etc ??related to dyspnea 02 sats are good---even with slow walk (but she is tachycardic) Can try oxygen

## 2012-01-30 ENCOUNTER — Ambulatory Visit: Payer: 59 | Admitting: Internal Medicine

## 2012-02-13 ENCOUNTER — Non-Acute Institutional Stay: Payer: 59 | Admitting: Internal Medicine

## 2012-02-13 ENCOUNTER — Encounter: Payer: Self-pay | Admitting: Internal Medicine

## 2012-02-13 VITALS — BP 130/70 | HR 88 | Temp 97.9°F | Resp 20 | Wt 115.0 lb

## 2012-02-13 DIAGNOSIS — R51 Headache: Secondary | ICD-10-CM

## 2012-02-13 NOTE — Assessment & Plan Note (Signed)
Persists No clear etiology RN feels oxygen may be helping Resident states she feels better with oxygen but headache persists  Okay to use tylenol prn Will try oxygen at night If headache persists to next week, trial of nortriptylline

## 2012-02-13 NOTE — Progress Notes (Signed)
  Subjective:    Patient ID: Rita Vasquez, female    DOB: May 23, 1916, 78 y.o.   MRN: [id number]  HPI Reviewed with Albin Felling RN Ongoing headaches but she thinks the oxygen has helped some  Resident states she feels better with the oxygen but headaches continue Not sleeping with the oxygen  Occ chest discomfort---gets "thumping" feeling in chest More SOB--though never too severe  Current Outpatient Prescriptions on File Prior to Visit  Medication Sig Dispense Refill  . aspirin 81 MG tablet Take 81 mg by mouth daily.        . Calcium-Vitamin D 500-125 MG-UNIT TABS Take by mouth 2 (two) times daily.        . digoxin (LANOXIN) 0.125 MG tablet Take 1 tablet (125 mcg total) by mouth daily.  30 tablet  11  . furosemide (LASIX) 20 MG tablet Take 20 mg by mouth daily as needed. If home weight is over 113#      . vitamin B-12 (CYANOCOBALAMIN) 1000 MCG tablet Take 1,000 mcg by mouth daily.        . Vitamin D, Ergocalciferol, (DRISDOL) 50000 UNITS CAPS Take 50,000 Units by mouth every 30 (thirty) days.          Allergies  Allergen Reactions  . Coreg     Couldn't tolerate    Past Medical History  Diagnosis Date  . CHF (congestive heart failure)   . Atrial fibrillation   . Non Hodgkin's lymphoma   . Arthritis   . Pulmonary embolism   . Osteoporosis   . Parotid cyst     Past Surgical History  Procedure Date  . Abdominal hysterectomy   . Cataract extraction   . Appendectomy   . Breast biopsy   . Vertebroplasty   . Mohs surgery     Family History  Problem Relation Age of Onset  . Cancer Sister   . Cancer Brother     History   Social History  . Marital Status: Widowed    Spouse Name: N/A    Number of Children: 3  . Years of Education: N/A   Occupational History  . 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 Smoking status: Former Games developer  . Smokeless tobacco: Never Used  . Alcohol Use: No  . Drug Use: No  . Sexually Active: Not  on file   Other Topics Concern  . Not on file   Social History Narrative   Requests DNR--done 04/03/11   Review of Systems Sleeps okay Appetite remains poor Has sig daytime somenolence     Objective:   Physical Exam  Constitutional: She appears well-developed. No distress.  Neck: Normal range of motion. Neck supple.  Cardiovascular: Normal rate, regular rhythm and normal heart sounds.  Exam reveals no gallop.   No murmur heard. Pulmonary/Chest: Effort normal and breath sounds normal. No respiratory distress. She has no wheezes. She has no rales.  Musculoskeletal: She exhibits no edema.  Lymphadenopathy:    She has no cervical adenopathy.  Neurological: She exhibits normal muscle tone.       No focal weakness  Psychiatric: She has a normal mood and affect. Her behavior is normal.          Assessment & Plan:

## 2012-02-28 ENCOUNTER — Encounter: Payer: Self-pay | Admitting: Internal Medicine

## 2012-03-17 ENCOUNTER — Non-Acute Institutional Stay: Payer: 59 | Admitting: Internal Medicine

## 2012-03-17 ENCOUNTER — Encounter: Payer: Self-pay | Admitting: Internal Medicine

## 2012-03-17 VITALS — BP 110/58 | HR 88 | Temp 98.1°F | Resp 20 | Wt 117.0 lb

## 2012-03-17 DIAGNOSIS — K111 Hypertrophy of salivary gland: Secondary | ICD-10-CM

## 2012-03-17 DIAGNOSIS — R51 Headache: Secondary | ICD-10-CM

## 2012-03-17 NOTE — Assessment & Plan Note (Signed)
Went away at first with the low dose nortriptylline Will increase to 20mg 

## 2012-03-17 NOTE — Assessment & Plan Note (Signed)
No history of stones Doesn't appear to be infected  Will set up appt with [facility]

## 2012-03-17 NOTE — Progress Notes (Signed)
  Subjective:    Patient ID: Rita Vasquez, female    DOB: 1916/02/13, 78 y.o.   MRN: [id number]  HPI Concerned about a lump under left ear Noticed it a week ago Saturday (10 days ) No pain No redness or pain Swallowing is okay--no problems with saliva  No other nodes she is aware of Lymphoma treated in 1999 and 2002 No fever  Headaches were better on the nortriptylline Now they have recurred Sleeps well--no AM grogginess  Current Outpatient Prescriptions on File Prior to Visit  Medication Sig Dispense Refill  . aspirin 81 MG tablet Take 81 mg by mouth daily.        . Calcium-Vitamin D 500-125 MG-UNIT TABS Take by mouth 2 (two) times daily.        . digoxin (LANOXIN) 0.125 MG tablet Take 1 tablet (125 mcg total) by mouth daily.  30 tablet  11  . furosemide (LASIX) 20 MG tablet Take 20 mg by mouth daily as needed. If home weight is over 113#      . nortriptyline (PAMELOR) 10 MG capsule Take 10 mg by mouth at bedtime.      . vitamin B-12 (CYANOCOBALAMIN) 1000 MCG tablet Take 1,000 mcg by mouth daily.        . Vitamin D, Ergocalciferol, (DRISDOL) 50000 UNITS CAPS Take 50,000 Units by mouth every 30 (thirty) days.          Allergies  Allergen Reactions  . Coreg     Couldn't tolerate    Past Medical History  Diagnosis Date  . CHF (congestive heart failure)   . Atrial fibrillation   . Non Hodgkin's lymphoma   . Arthritis   . Pulmonary embolism   . Osteoporosis   . Parotid cyst     Past Surgical History  Procedure Date  . Abdominal hysterectomy   . Cataract extraction   . Appendectomy   . Breast biopsy   . Vertebroplasty   . Mohs surgery     Family History  Problem Relation Age of Onset  . Cancer Sister   . Cancer Brother     History   Social History  . Marital Status: Widowed    Spouse Name: N/A    Number of Children: 3  . Years of Education: N/A   Occupational History  . 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 Smoking status: Former Games developer  . Smokeless tobacco: Never Used  . Alcohol Use: No  . Drug Use: No  . Sexually Active: Not on file   Other Topics Concern  . Not on file   Social History Narrative   Requests DNR--done 04/03/11     Review of Systems No fever Weight up 2#    Objective:   Physical Exam  Constitutional: She appears well-developed and well-nourished. No distress.  HENT:  Mouth/Throat: Oropharynx is clear and moist. No oropharyngeal exudate.       Right parotid enlarged Firm but not red or tender   Neck: Normal range of motion. Neck supple. No thyromegaly present.  Lymphadenopathy:    She has no cervical adenopathy.          Assessment & Plan:

## 2012-04-15 ENCOUNTER — Ambulatory Visit: Payer: Self-pay | Admitting: Otolaryngology

## 2012-04-15 LAB — CREATININE, SERUM
EGFR (African American): >60
EGFR (Non-African Amer.): 59 — ABNORMAL LOW

## 2012-04-24 ENCOUNTER — Ambulatory Visit: Payer: Self-pay | Admitting: Otolaryngology

## 2012-04-28 LAB — PATHOLOGY REPORT

## 2012-05-21 ENCOUNTER — Encounter: Payer: Self-pay | Admitting: Internal Medicine

## 2012-05-21 ENCOUNTER — Non-Acute Institutional Stay: Payer: 59 | Admitting: Internal Medicine

## 2012-05-21 VITALS — BP 100/60 | HR 80 | Temp 98.1°F | Resp 22 | Wt 122.0 lb

## 2012-05-21 DIAGNOSIS — K111 Hypertrophy of salivary gland: Secondary | ICD-10-CM

## 2012-05-21 DIAGNOSIS — I5023 Acute on chronic systolic (congestive) heart failure: Secondary | ICD-10-CM

## 2012-05-21 DIAGNOSIS — R51 Headache: Secondary | ICD-10-CM

## 2012-05-21 DIAGNOSIS — I4891 Unspecified atrial fibrillation: Secondary | ICD-10-CM

## 2012-05-21 NOTE — Assessment & Plan Note (Signed)
Fine needle biopsy not definitive Holding off on more risky core biopsy for now ENT follow up

## 2012-05-21 NOTE — Assessment & Plan Note (Signed)
4-5# of extra fluid may be enough to cause her mild exacerbation Will add second dose of furosemide if her weight is over 120# Gets weighed daily

## 2012-05-21 NOTE — Progress Notes (Signed)
Subjective:    Patient ID: Rita Vasquez, female    DOB: 02-05-1916, 78 y.o.   MRN: [id number]  HPI Having more SOB Now notices dyspnea with bending over, taking a shower (despite the help she gets) Takes the furosemide every day Weight has been up a few pounds---not much change in the past few days No chest pain Does occ get palpitations Some increase in ankle edema Props at night on 2 pillows--no PND  Had parotid biopsy Not definitive but no clear cancer Now just on observation ---didn't want surgery to do more definitve care  Still with AM headache May take till noon for headache to resolve Tramadol does help but doesn't quickly resolve nortriptylline not really helpful---has been stopped  Current Outpatient Prescriptions on File Prior to Visit  Medication Sig Dispense Refill  . aspirin 81 MG tablet Take 81 mg by mouth daily.        . Calcium-Vitamin D 500-125 MG-UNIT TABS Take by mouth 2 (two) times daily.        . digoxin (LANOXIN) 0.125 MG tablet Take 1 tablet (125 mcg total) by mouth daily.  30 tablet  11  . furosemide (LASIX) 20 MG tablet Take 40 mg by mouth daily. If home weight is over 113#      . vitamin B-12 (CYANOCOBALAMIN) 1000 MCG tablet Take 1,000 mcg by mouth daily.        . Vitamin D, Ergocalciferol, (DRISDOL) 50000 UNITS CAPS Take 50,000 Units by mouth every 30 (thirty) days.          Allergies  Allergen Reactions  . Coreg     Couldn't tolerate    Past Medical History  Diagnosis Date  . CHF (congestive heart failure)   . Atrial fibrillation   . Non Hodgkin's lymphoma   . Arthritis   . Pulmonary embolism   . Osteoporosis   . Parotid cyst     Past Surgical History  Procedure Date  . Abdominal hysterectomy   . Cataract extraction   . Appendectomy   . Breast biopsy   . Vertebroplasty   . Mohs surgery     Family History  Problem Relation Age of Onset  . Cancer Sister   . Cancer Brother     History   Social History  . Marital  Status: Widowed    Spouse Name: N/A    Number of Children: 3  . Years of Education: N/A   Occupational History  . 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 Smoking status: Former Games developer  . Smokeless tobacco: Never Used  . Alcohol Use: No  . Drug Use: No  . Sexually Active: Not on file   Other Topics Concern  . Not on file   Social History Narrative   Requests DNR--done 04/03/11   Review of Systems Sleeps okay Weight remains poor    Objective:   Physical Exam  Constitutional: She appears well-developed and well-nourished. No distress.  Neck: Normal range of motion.       Right parotid enlargement persists  Cardiovascular: Normal rate, regular rhythm and normal heart sounds.  Exam reveals no gallop.   No murmur heard. Pulmonary/Chest: Effort normal and breath sounds normal. No respiratory distress. She has no wheezes. She has no rales.       No dullness to perscussion  Abdominal: Soft. There is no tenderness.  Musculoskeletal: She exhibits edema.       1+ ankle edema  Lymphadenopathy:    She has no cervical adenopathy.  Psychiatric: She has a normal mood and affect. Her behavior is normal.          Assessment & Plan:

## 2012-05-21 NOTE — Assessment & Plan Note (Signed)
Continues Defies diagnosis Uses the tramadol

## 2012-05-21 NOTE — Assessment & Plan Note (Signed)
Rhythm seems normal today Rate is controlled on the digoxin

## 2012-06-13 ENCOUNTER — Emergency Department: Payer: Self-pay | Admitting: Emergency Medicine

## 2012-06-13 LAB — BASIC METABOLIC PANEL
Anion Gap: 10 (ref 7–16)
BUN: 17 mg/dL (ref 7–18)
Chloride: 98 mmol/L (ref 98–107)
Creatinine: 0.88 mg/dL (ref 0.60–1.30)
EGFR (African American): >60
EGFR (Non-African Amer.): 56 — ABNORMAL LOW
Glucose: 109 mg/dL — ABNORMAL HIGH (ref 65–99)
Osmolality: 280 (ref 275–301)

## 2012-06-13 LAB — CBC
HCT: 36.7 % (ref 35.0–47.0)
MCV: 96 fL (ref 80–100)
Platelet: 142 10*3/uL — ABNORMAL LOW (ref 150–440)
RBC: 3.84 10*6/uL (ref 3.80–5.20)
WBC: 6.8 10*3/uL (ref 3.6–11.0)

## 2012-06-13 LAB — CK TOTAL AND CKMB (NOT AT ARMC)
CK, Total: 79 U/L (ref 21–215)
CK, Total: 71 U/L (ref 21–215)

## 2012-06-13 LAB — PRO B NATRIURETIC PEPTIDE: B-Type Natriuretic Peptide: 6464 pg/mL — ABNORMAL HIGH (ref 0–450)

## 2012-06-13 LAB — TROPONIN I
Troponin-I: 0.04 ng/mL
Troponin-I: 0.04 ng/mL

## 2012-06-15 ENCOUNTER — Other Ambulatory Visit: Payer: Self-pay | Admitting: Internal Medicine

## 2012-06-15 ENCOUNTER — Telehealth: Payer: Self-pay | Admitting: Internal Medicine

## 2012-06-15 DIAGNOSIS — J189 Pneumonia, unspecified organism: Secondary | ICD-10-CM

## 2012-06-15 DIAGNOSIS — S1093XA Contusion of unspecified part of neck, initial encounter: Secondary | ICD-10-CM

## 2012-06-15 DIAGNOSIS — S0003XA Contusion of scalp, initial encounter: Secondary | ICD-10-CM

## 2012-06-15 DIAGNOSIS — S0083XA Contusion of other part of head, initial encounter: Secondary | ICD-10-CM

## 2012-06-15 NOTE — Telephone Encounter (Signed)
[facility] °Triage Call Report °Triage Record Num: [id number] Operator: Donna Calderwood °Patient Name: Rita Vasquez Call Date & Time: 06/14/2012 12:22:52PM °Patient Phone: [phone] PCP: Javier Gutierrez °Patient Gender: Female PCP Fax : [phone] °Patient DOB: 08/13/1956 Practice Name: [facility] °Reason for Call: °Caller: [facility]; PCP: Gutierrez, Javier (Family Practice); CB#: [phone]; Call °regarding Medication Issue; Medication(s): albuterol; 06-14-12 she is asking for refill of her °Albuterol for her nebulizer. She started 06-12-12 with some slight shortness of breath and a °cough. 06-04-12 she called EMS and they came out and gave her a nebulizer treatment and °she felt some better so she didn't go to hospital She is beginning to feel short of breath again °and she has a very congested cough she says it very thick, no fever. She does sound short of °breath while talking to her. Per Breathing Problems 911 sx of worsening breathing problems °and known cardiac or respiratory condition and not responding to treatment of treatment not °available. Advised to call 911 and to have them take her to the hospital this time °Protocol(s) Used: Breathing Problems °Recommended Outcome per Protocol: Activate EMS 911 °Reason for Outcome: Worsening breathing problems AND known °cardiac or respiratory condition (coronary artery °disease, angina, heart failure, asthma, chronic °bronchitis, COPD) NOT responding to treatment °OR treatment not available. °Care Advice: °~ °06/14/2012 12:33:45PM Page 1 of 1 [id number] °

## 2012-06-15 NOTE — Telephone Encounter (Signed)
I have seen her at [facility] already today

## 2012-06-18 LAB — CULTURE, BLOOD (SINGLE)

## 2012-06-19 ENCOUNTER — Non-Acute Institutional Stay: Payer: 59 | Admitting: Family Medicine

## 2012-06-19 VITALS — BP 104/60 | HR 64 | Temp 97.8°F | Resp 18 | Wt 113.0 lb

## 2012-06-19 DIAGNOSIS — J189 Pneumonia, unspecified organism: Secondary | ICD-10-CM

## 2012-06-19 DIAGNOSIS — I5022 Chronic systolic (congestive) heart failure: Secondary | ICD-10-CM

## 2012-06-19 DIAGNOSIS — R51 Headache: Secondary | ICD-10-CM

## 2012-06-19 NOTE — Progress Notes (Signed)
Subjective:    Patient ID: Rita Vasquez, female    DOB: 04-24-1916, 79 y.o.   MRN: [id number]  HPI 79 yo h/o O2 dependent CHF whom I was asked to see for Healthcare follow up.  Was sent to [facility] ER on Saturday with CP and SOB.  Per Rita Vasquez, diagnosed with pneumonia.  On Augmentin.  Then went to healthcare for a few days and now back in her room.  I do not have hospital records but she was evaluated by Dr. Alphonsus Vasquez at Healthcare.  She has been in her room since Monday and has not left her chair out of fear of contaminating others.  Does feel weak, actually fell on Saturday- tripped on the way to the bathroom, hit her head.  Still complaining of SOB.  Wt is 113- does not appear volume overloaded.  On Lasix daily (second daily dose held for weight less than 120 lb).  .  Still having a headache every morning.  Dr. Karle Vasquez notes reviewed- she has been referred to [facility] clinic and we are awaiting an appointemnt.  Tramadol is helping to take the edge off.  Current Outpatient Prescriptions on File Prior to Visit  Medication Sig Dispense Refill  . aspirin 81 MG tablet Take 81 mg by mouth daily.        . Calcium-Vitamin D 500-125 MG-UNIT TABS Take by mouth 2 (two) times daily.        . digoxin (LANOXIN) 0.125 MG tablet TAKE (1) TABLET BY MOUTH DAILY FOR HEART.  30 tablet  11  . furosemide (LASIX) 20 MG tablet Take 40 mg by mouth daily. And second dose at lunch if AM weight is over 120#      . vitamin B-12 (CYANOCOBALAMIN) 1000 MCG tablet Take 1,000 mcg by mouth daily.        . Vitamin D, Ergocalciferol, (DRISDOL) 50000 UNITS CAPS Take 50,000 Units by mouth every 30 (thirty) days.          Allergies  Allergen Reactions  . Coreg     Couldn't tolerate    Past Medical History  Diagnosis Date  . CHF (congestive heart failure)   . Atrial fibrillation   . Non Hodgkin's lymphoma   . Arthritis   . Pulmonary embolism   . Osteoporosis   . Parotid cyst     Past Surgical History  Procedure  Date  . Abdominal hysterectomy   . Cataract extraction   . Appendectomy   . Breast biopsy   . Vertebroplasty   . Mohs surgery     Family History  Problem Relation Age of Onset  . Cancer Sister   . Cancer Brother     History   Social History  . Marital Status: Widowed    Spouse Name: N/A    Number of Children: 3  . Years of Education: N/A   Occupational History  . 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 Smoking status: Former Games developer  . Smokeless tobacco: Never Used  . Alcohol Use: No  . Drug Use: No  . Sexually Active: Not on file   Other Topics Concern  . Not on file   Social History Narrative   Requests DNR--done 04/03/11   Review of Systems See HPI No CP    Objective:   Physical Exam  BP 104/60  Pulse 64  Temp 97.8 F (36.6 C)  Resp 18  Wt 113 lb (51.256 kg)  Constitutional:  She appears well-developed and well-nourished. No distress.  Large echymosis with overlying steristrips on right forehead Neck: Normal range of motion.       Right parotid enlargement  Cardiovascular: Normal rate, regular rhythm and normal heart sounds.  Exam reveals no gallop.   No murmur heard. Pulmonary/Chest: Effort normal and breath sounds normal. No respiratory distress. She has no wheezes. She has no rales.   Abdominal: Soft. There is no tenderness.  Musculoskeletal: No edema Lymphadenopathy:    She has no cervical adenopathy.  Psychiatric: She has a normal mood and affect. Her behavior is normal.          Assessment & Plan:   1. Pneumonia  Lungs quite clear on exam today.  She is deconditioned and would benefit from some PT but she is refusing this currently.  Discussed this with Rita Felling, RN.  2. Chronic systolic heart failure  Euvolemic.  3. Headache  Long standing issue.  She has appt with [facility] clinic.   Fall did not help but she does not have much pain in the area that was hit.

## 2012-06-26 ENCOUNTER — Ambulatory Visit (INDEPENDENT_AMBULATORY_CARE_PROVIDER_SITE_OTHER): Payer: 59 | Admitting: Internal Medicine

## 2012-06-26 ENCOUNTER — Encounter: Payer: Self-pay | Admitting: Internal Medicine

## 2012-06-26 ENCOUNTER — Ambulatory Visit (INDEPENDENT_AMBULATORY_CARE_PROVIDER_SITE_OTHER)
Admission: RE | Admit: 2012-06-26 | Discharge: 2012-06-26 | Disposition: A | Discharge status: Home / Self Care | Payer: 59 | Source: Ambulatory Visit | Attending: Internal Medicine | Admitting: Internal Medicine

## 2012-06-26 VITALS — BP 110/60 | HR 75 | Temp 98.3°F | Wt 110.0 lb

## 2012-06-26 DIAGNOSIS — I5022 Chronic systolic (congestive) heart failure: Secondary | ICD-10-CM

## 2012-06-26 DIAGNOSIS — J189 Pneumonia, unspecified organism: Secondary | ICD-10-CM

## 2012-06-26 DIAGNOSIS — R51 Headache: Secondary | ICD-10-CM

## 2012-06-26 DIAGNOSIS — I4891 Unspecified atrial fibrillation: Secondary | ICD-10-CM

## 2012-06-26 NOTE — Assessment & Plan Note (Addendum)
Fairly severe Not sure what else to do--intolerant of multiple meds Lasix per weight status

## 2012-06-26 NOTE — Assessment & Plan Note (Signed)
Has specialist consult next week

## 2012-06-26 NOTE — Assessment & Plan Note (Signed)
No clinical infection now Done with antibiotics Not sure she ever had true infection---original ER records reviewed and CXR was nebulous Will recheck CXR now

## 2012-06-26 NOTE — Progress Notes (Signed)
  Subjective:    Patient ID: Rita Vasquez, female    DOB: 1916-06-08, 79 y.o.   MRN: [id number]  HPI Here with daughter  Done with antibiotics No cough or fever Breathing is still labored --with minimal exertion Weight is down now---hasn't needed second furosemide  Recently saw Rudi Coco NP at [facility] No changes  No chest pain Occ gets palpitations---when at rest  Parotid feels about the same Has ENT follow up with Dr Willeen Cass next month  Current Outpatient Prescriptions on File Prior to Visit  Medication Sig Dispense Refill  . aspirin 81 MG tablet Take 81 mg by mouth daily.        . Calcium-Vitamin D 500-125 MG-UNIT TABS Take by mouth 2 (two) times daily.        . digoxin (LANOXIN) 0.125 MG tablet TAKE (1) TABLET BY MOUTH DAILY FOR HEART.  30 tablet  11  . furosemide (LASIX) 20 MG tablet Take 40 mg by mouth daily. And second dose at lunch if AM weight is over 120#      . vitamin B-12 (CYANOCOBALAMIN) 1000 MCG tablet Take 1,000 mcg by mouth daily.        . Vitamin D, Ergocalciferol, (DRISDOL) 50000 UNITS CAPS Take 50,000 Units by mouth every 30 (thirty) days.          Allergies  Allergen Reactions  . Coreg     Couldn't tolerate    Past Medical History  Diagnosis Date  . CHF (congestive heart failure)   . Atrial fibrillation   . Non Hodgkin's lymphoma   . Arthritis   . Pulmonary embolism   . Osteoporosis   . Parotid cyst     Past Surgical History  Procedure Date  . Abdominal hysterectomy   . Cataract extraction   . Appendectomy   . Breast biopsy   . Vertebroplasty   . Mohs surgery     Family History  Problem Relation Age of Onset  . Cancer Sister   . Cancer Brother     History   Social History  . Marital Status: Widowed    Spouse Name: N/A    Number of Children: 3  . Years of Education: N/A   Occupational History  . 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 Smoking status: Former Games developer  .  Smokeless tobacco: Never Used  . Alcohol Use: No  . Drug Use: No  . Sexually Active: Not on file   Other Topics Concern  . Not on file   Social History Narrative   Requests DNR--done 04/03/11   Review of Systems Appetite is poor Tries to walk regularly---still doing PT also    Objective:   Physical Exam  Constitutional: She appears well-developed. No distress.  Neck: Normal range of motion. Neck supple.  Cardiovascular: Normal rate and regular rhythm.        Seems regular now ?soft systolic murmur  Pulmonary/Chest: Effort normal. No respiratory distress. She has no wheezes. She has no rales.       Decreased breath sounds but clear  Abdominal: Soft. There is no tenderness.  Musculoskeletal: She exhibits no edema.  Lymphadenopathy:    She has no cervical adenopathy.  Psychiatric: She has a normal mood and affect. Her behavior is normal.          Assessment & Plan:

## 2012-06-26 NOTE — Assessment & Plan Note (Signed)
Seems regular now on dig ASA due to bleeding risk

## 2012-08-03 ENCOUNTER — Other Ambulatory Visit: Payer: Self-pay | Admitting: Specialist

## 2012-08-10 ENCOUNTER — Ambulatory Visit
Admission: RE | Admit: 2012-08-10 | Discharge: 2012-08-10 | Disposition: A | Discharge status: Home / Self Care | Payer: Medicare Other | Source: Ambulatory Visit | Attending: Specialist | Admitting: Specialist

## 2012-08-20 ENCOUNTER — Encounter: Payer: Self-pay | Admitting: Internal Medicine

## 2012-10-08 ENCOUNTER — Encounter: Payer: Self-pay | Admitting: Internal Medicine

## 2012-10-08 ENCOUNTER — Non-Acute Institutional Stay: Payer: Medicare Other | Admitting: Internal Medicine

## 2012-10-08 VITALS — BP 100/54 | HR 60 | Temp 98.3°F | Resp 16 | Wt 115.0 lb

## 2012-10-08 DIAGNOSIS — I5022 Chronic systolic (congestive) heart failure: Secondary | ICD-10-CM

## 2012-10-08 DIAGNOSIS — M810 Age-related osteoporosis without current pathological fracture: Secondary | ICD-10-CM

## 2012-10-08 DIAGNOSIS — K111 Hypertrophy of salivary gland: Secondary | ICD-10-CM

## 2012-10-08 DIAGNOSIS — R51 Headache: Secondary | ICD-10-CM

## 2012-10-08 DIAGNOSIS — I4891 Unspecified atrial fibrillation: Secondary | ICD-10-CM

## 2012-10-08 NOTE — Assessment & Plan Note (Signed)
Will continue vitamin D only She does regular walking in her routine

## 2012-10-08 NOTE — Progress Notes (Signed)
Subjective:    Patient ID: Rita Vasquez, female    DOB: [birth year], 79 y.o.   MRN: [id number]  HPI Reviewed status with Albin Felling RN  Doing fairly well Headache is some better with evening gabapentin Still following with headache specialist  Still with mild parotid enlargement CT didn't show anything worrisome ENT follow up is prn only  Still gets easy DOE--this is stable Does have dyspnea with shower, bending down to put on shoes, etc Able to do other personal care without symptoms Occasional fluttering in heart but no rapid beating No chest pain  Current Outpatient Prescriptions on File Prior to Visit  Medication Sig Dispense Refill  . aspirin 81 MG tablet Take 81 mg by mouth daily.        . Calcium-Vitamin D 500-125 MG-UNIT TABS Take by mouth 2 (two) times daily.        . digoxin (LANOXIN) 0.125 MG tablet TAKE (1) TABLET BY MOUTH DAILY FOR HEART.  30 tablet  11  . furosemide (LASIX) 20 MG tablet Take 40 mg by mouth daily. And second dose at lunch if AM weight is over 120#      . vitamin B-12 (CYANOCOBALAMIN) 1000 MCG tablet Take 1,000 mcg by mouth daily.        . Vitamin D, Ergocalciferol, (DRISDOL) 50000 UNITS CAPS Take 50,000 Units by mouth every 30 (thirty) days.         No current facility-administered medications on file prior to visit.    Allergies  Allergen Reactions  . Coreg     Couldn't tolerate    Past Medical History  Diagnosis Date  . CHF (congestive heart failure)   . Atrial fibrillation   . Non Hodgkin's lymphoma   . Arthritis   . Pulmonary embolism   . Osteoporosis   . Parotid cyst     Past Surgical History  Procedure Laterality Date  . Abdominal hysterectomy    . Cataract extraction    . Appendectomy    . Breast biopsy    . Vertebroplasty    . Mohs surgery      Family History  Problem Relation Age of Onset  . Cancer Sister   . Cancer Brother     History   Social History  . Marital Status: Widowed    Spouse Name: N/A   Number of Children: 3  . Years of Education: N/A   Occupational History  . 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 Smoking status: Former Games developer  . Smokeless tobacco: Never Used  . Alcohol Use: No  . Drug Use: No  . Sexually Active: Not on file   Other Topics Concern  . Not on file   Social History Narrative   Requests DNR--done 04/03/11   Review of Systems Appetite is fair Weight is stable Sleeps okay Bowels are slow at times---uses MOM with success at times    Objective:   Physical Exam  Constitutional: She appears well-developed and well-nourished. No distress.  Neck: Normal range of motion. Neck supple.  Small, firm but mobile right parotid. Non tender  Cardiovascular: Normal rate and normal heart sounds.  Exam reveals no gallop.   No murmur heard. irregular  Pulmonary/Chest: Effort normal and breath sounds normal. No respiratory distress. She has no wheezes. She has no rales.  Abdominal: Soft. There is no tenderness.  Musculoskeletal: She exhibits no edema and no tenderness.  Lymphadenopathy:    She has no  cervical adenopathy.  Psychiatric: She has a normal mood and affect. Her behavior is normal.          Assessment & Plan:

## 2012-10-08 NOTE — Assessment & Plan Note (Signed)
Better since on the gabapentin

## 2012-10-08 NOTE — Assessment & Plan Note (Signed)
Benign Observation only

## 2012-10-08 NOTE — Assessment & Plan Note (Signed)
Rate control is good on digoxin ASA only due to bleeding risk

## 2012-10-08 NOTE — Assessment & Plan Note (Signed)
Has settled down Stable dyspnea but able to be fairly independent Diuretic is weight based No changes needed

## 2012-12-11 ENCOUNTER — Encounter: Payer: Self-pay | Admitting: Internal Medicine

## 2012-12-31 ENCOUNTER — Non-Acute Institutional Stay: Payer: Medicare Other | Admitting: Internal Medicine

## 2012-12-31 ENCOUNTER — Encounter: Payer: Self-pay | Admitting: Internal Medicine

## 2012-12-31 VITALS — BP 132/70 | HR 84 | Resp 24 | Wt 117.0 lb

## 2012-12-31 DIAGNOSIS — I5022 Chronic systolic (congestive) heart failure: Secondary | ICD-10-CM

## 2012-12-31 DIAGNOSIS — I4891 Unspecified atrial fibrillation: Secondary | ICD-10-CM

## 2012-12-31 DIAGNOSIS — R51 Headache: Secondary | ICD-10-CM

## 2012-12-31 DIAGNOSIS — R209 Unspecified disturbances of skin sensation: Secondary | ICD-10-CM

## 2012-12-31 DIAGNOSIS — R200 Anesthesia of skin: Secondary | ICD-10-CM

## 2012-12-31 DIAGNOSIS — K111 Hypertrophy of salivary gland: Secondary | ICD-10-CM

## 2012-12-31 NOTE — Assessment & Plan Note (Signed)
Continues with headache specialist She doesn't note improvement with increased gabapentin but may have increased sedation. Can consider moving AM dose to after lunch as neuro orders indicated

## 2012-12-31 NOTE — Assessment & Plan Note (Signed)
Stable DOE No changes needed 

## 2012-12-31 NOTE — Assessment & Plan Note (Signed)
Small and stable No further evaluation unless further change

## 2012-12-31 NOTE — Assessment & Plan Note (Signed)
Regular now--Back in sinus? Still on digoxin and ASA

## 2012-12-31 NOTE — Progress Notes (Signed)
Subjective:    Patient ID: Rita Vasquez, female    DOB: 05/21/1916, 79 y.o.   MRN: [id number]  HPI Reviewed status with RN here  She feels weaker Not clear why but now has AM gabapentin from headache specialist  Notes thickening of her fingers Fingertips are numb  Still having headaches Just at headache clinic recently Getting trigger point injections in scalp---not clear if it is helping  Heart seems okay Some SOB--stable DOE No chest pain Occasional palpitation when just sitting  Right parotid gland feels the same No pain No trouble swallowing  Current Outpatient Prescriptions on File Prior to Visit  Medication Sig Dispense Refill  . aspirin 81 MG tablet Take 81 mg by mouth daily.        . digoxin (LANOXIN) 0.125 MG tablet TAKE (1) TABLET BY MOUTH DAILY FOR HEART.  30 tablet  11  . fluticasone (FLONASE) 50 MCG/ACT nasal spray Place 2 sprays into the nose daily.      . furosemide (LASIX) 20 MG tablet Take 40 mg by mouth daily. And second dose at lunch if AM weight is over 120#      . nitroGLYCERIN (NITROSTAT) 0.4 MG SL tablet Place 0.4 mg under the tongue every 5 (five) minutes as needed for chest pain.      . vitamin B-12 (CYANOCOBALAMIN) 1000 MCG tablet Take 1,000 mcg by mouth daily.        . Vitamin D, Ergocalciferol, (DRISDOL) 50000 UNITS CAPS Take 50,000 Units by mouth every 30 (thirty) days.        Marland Kitchen gabapentin (NEURONTIN) 300 MG capsule Take 600 mg by mouth 2 (two) times daily.        No current facility-administered medications on file prior to visit.    Allergies  Allergen Reactions  . Coreg     Couldn't tolerate    Past Medical History  Diagnosis Date  . CHF (congestive heart failure)   . Atrial fibrillation   . Non Hodgkin's lymphoma   . Arthritis   . Pulmonary embolism   . Osteoporosis   . Parotid cyst     Past Surgical History  Procedure Laterality Date  . Abdominal hysterectomy    . Cataract extraction    . Appendectomy    . Breast  biopsy    . Vertebroplasty    . Mohs surgery      Family History  Problem Relation Age of Onset  . Cancer Sister   . Cancer Brother     History   Social History  . Marital Status: Widowed    Spouse Name: N/A    Number of Children: 3  . Years of Education: N/A   Occupational History  . 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 Smoking status: Former Games developer  . Smokeless tobacco: Never Used  . Alcohol Use: No  . Drug Use: No  . Sexually Active: Not on file   Other Topics Concern  . Not on file   Social History Narrative   Requests DNR--done 04/03/11   Review of Systems Sleeping well Appetite is fine Weight stable Bowels are moving fine Going to get her hearing aides checked today    Objective:   Physical Exam  Constitutional: She appears well-developed. No distress.  Neck: Normal range of motion. Neck supple. No thyromegaly present.  Cardiovascular: Normal rate and regular rhythm.  Exam reveals no gallop.   Murmur heard. Regular! Soft systolic murmur  at apex  Pulmonary/Chest: Effort normal and breath sounds normal. No respiratory distress. She has no wheezes. She has no rales.  Abdominal: Soft. There is no tenderness.  Musculoskeletal: She exhibits edema.  Calves full without pitting or true edema Thickening in hands without synovitis  Lymphadenopathy:    She has no cervical adenopathy.  Psychiatric: She has a normal mood and affect. Her behavior is normal.          Assessment & Plan:

## 2012-12-31 NOTE — Assessment & Plan Note (Signed)
In fingers Will just check thyroid and B12

## 2013-01-08 ENCOUNTER — Encounter: Payer: Self-pay | Admitting: Internal Medicine

## 2013-02-13 IMAGING — CT CT NECK WITH CONTRAST
1 series · 12 of 14 positions shown, 15 images · IV contrast (agent unspecified)
Comparison: none

REASON FOR EXAM: LABS FIRST Rt Parotid Mass
COMMENTS:

PROCEDURE:     CT  - CT NECK WITH CONTRAST  - April 15, 2012 [DATE]
RESULT:     History: Parotid mass.
Comparison Study: No recent.

[Series 4: lung windows · axial · 0.66mm/px · z∈[-247,-151]mm · 12 of 38 slices shown, 15 images]
[im 3/38  soft-tissue]
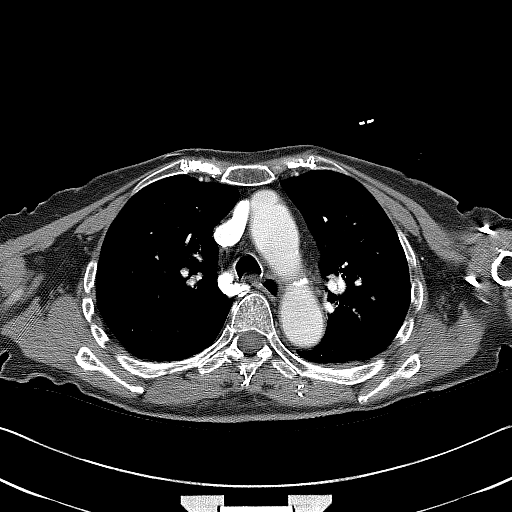
[im 3/38  bone]
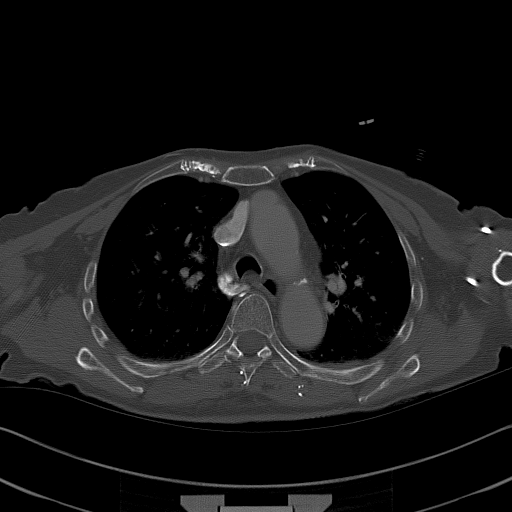
[im 6/38  bone]
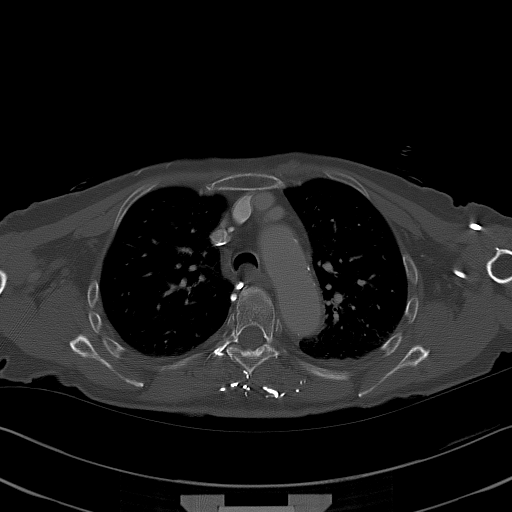
[im 9/38  bone]
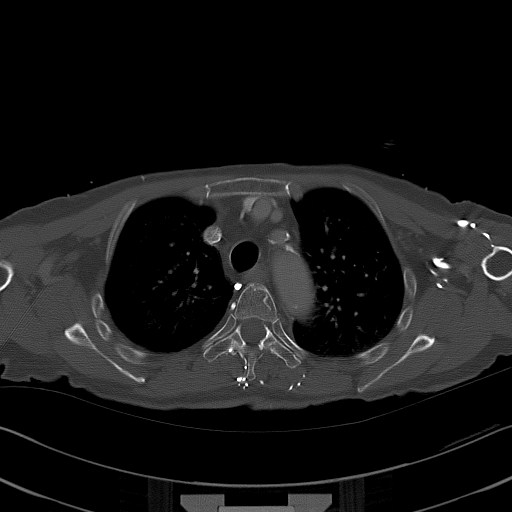
[im 12/38  bone]
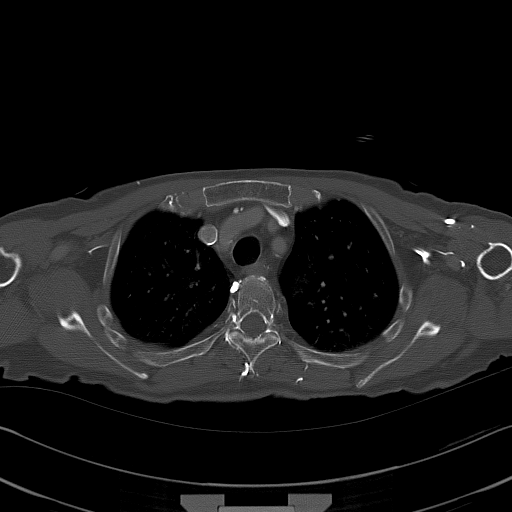
[im 15/38  soft-tissue]
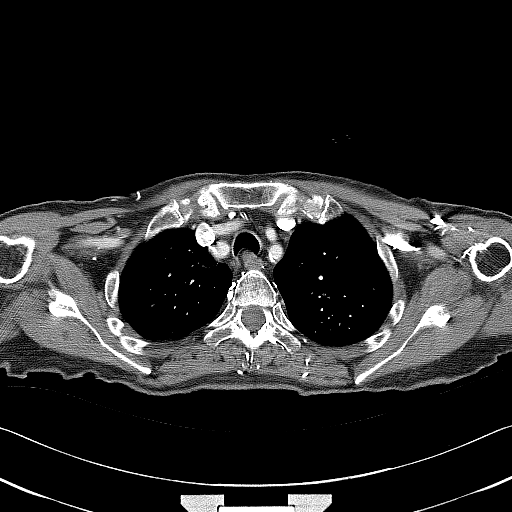
[im 15/38  bone]
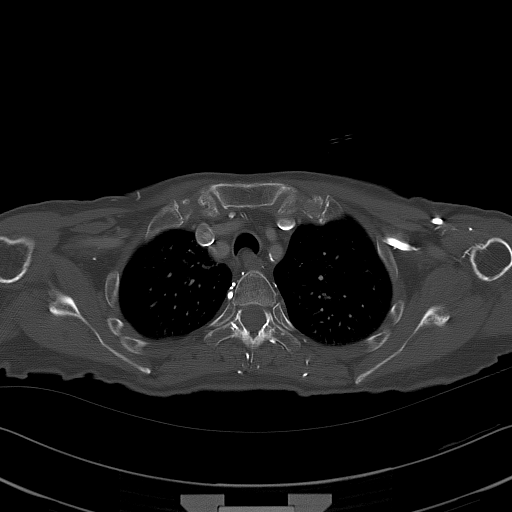
[im 18/38  bone]
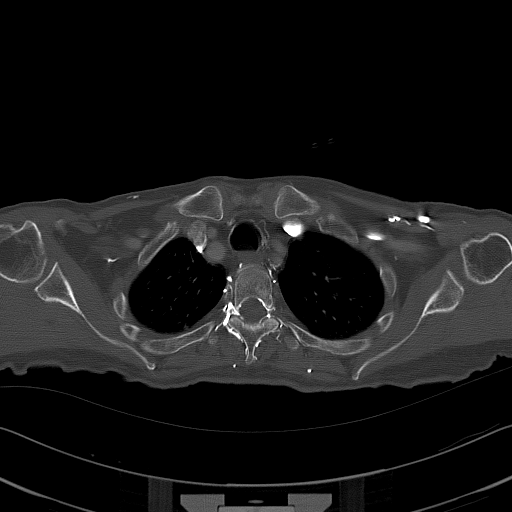
[im 20/38  bone]
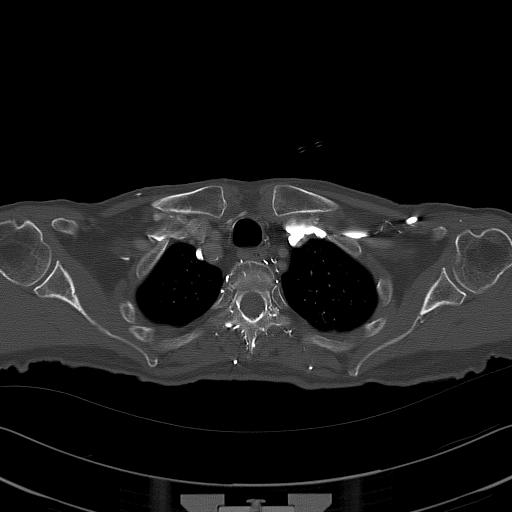
[im 23/38  bone]
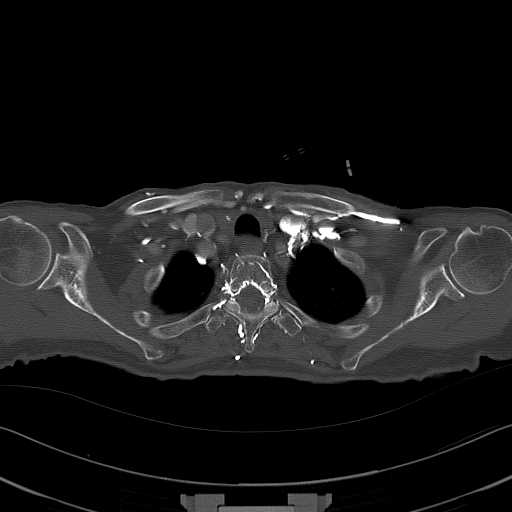
[im 26/38  soft-tissue]
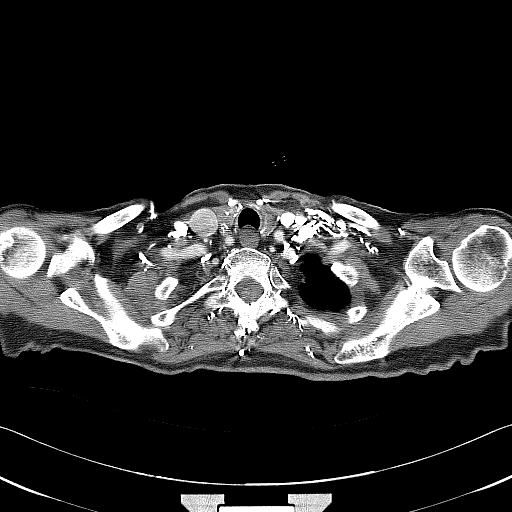
[im 26/38  bone]
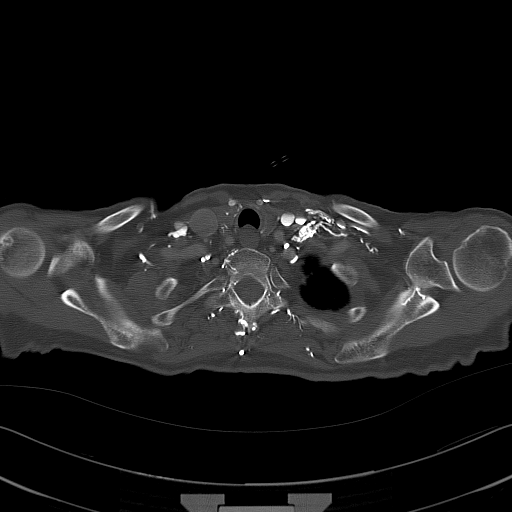
[im 29/38  bone]
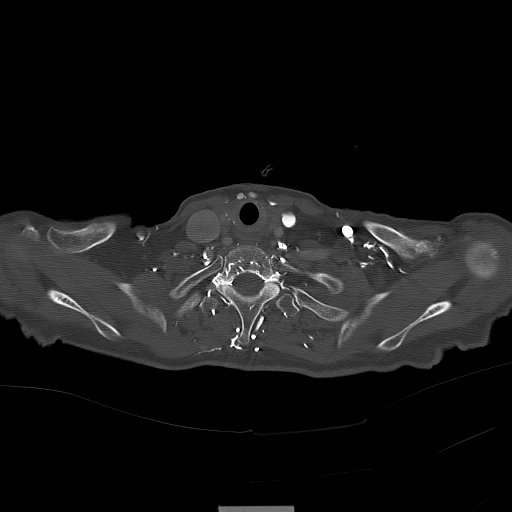
[im 32/38  bone]
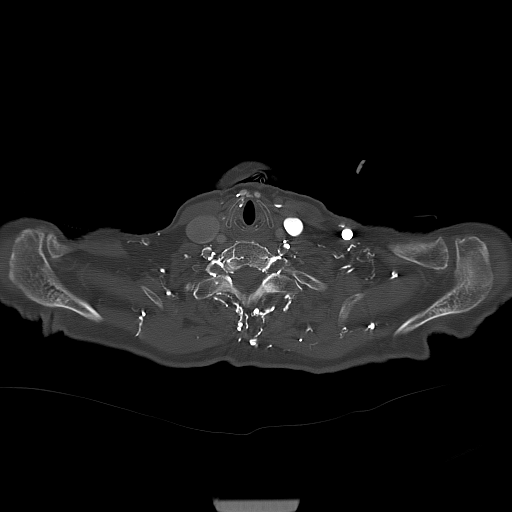
[im 35/38  bone]
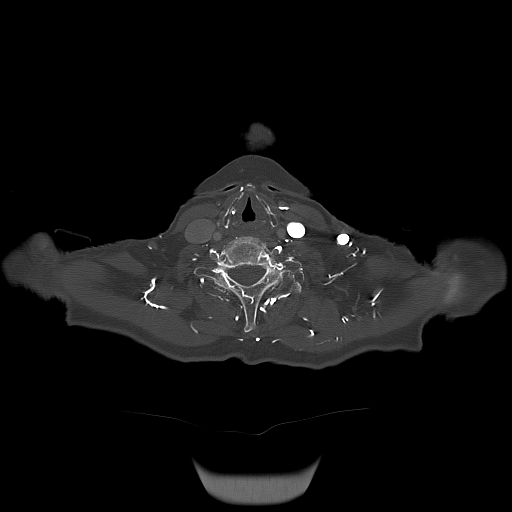

[12 of 14 positions shown; findings below may reference images not displayed]

FINDINGS: Standard CT obtained with 75 cc of Isovue 300. Brain and skull
base normal. Orbits normal. Paranasal sinuses normal. Nasal pharynx and
oropharynx  normal. Larynx normal. Large airways patent. Thyroid normal.
Multiple prominent soft tissue nodules are noted throughout the right
parotid gland. Largest measures 1.7 cm in length. These findings are
worrisome for multifocal tumor involvement in the right parotid. Left
carotid is unremarkable. Submandibular glands are normal. No prominent
cervical lymph nodes are noted. Shotty cervical lymph nodes are noted in
right anterior jugular chain. Pulmonary apices are normal.
IMPRESSION: Multiple solid nodular lesions are noted throughout the
right parotid gland. This is worrisome for multifocal tumor involvement in
the right parotid. Shotty right anterior cervical lymph nodes are present.
We can perform PET/CT for further evaluation if clinically indicated.

## 2013-03-11 ENCOUNTER — Non-Acute Institutional Stay: Payer: Medicare Other | Admitting: Internal Medicine

## 2013-03-11 ENCOUNTER — Encounter: Payer: Self-pay | Admitting: Internal Medicine

## 2013-03-11 VITALS — BP 104/60 | HR 70 | Temp 98.1°F | Resp 18 | Wt 120.0 lb

## 2013-03-11 DIAGNOSIS — K111 Hypertrophy of salivary gland: Secondary | ICD-10-CM

## 2013-03-11 DIAGNOSIS — I4891 Unspecified atrial fibrillation: Secondary | ICD-10-CM

## 2013-03-11 DIAGNOSIS — I5022 Chronic systolic (congestive) heart failure: Secondary | ICD-10-CM

## 2013-03-11 DIAGNOSIS — R51 Headache: Secondary | ICD-10-CM

## 2013-03-11 NOTE — Assessment & Plan Note (Signed)
No symptoms and no increase in size

## 2013-03-11 NOTE — Assessment & Plan Note (Signed)
Good rate control ASA only 

## 2013-03-11 NOTE — Progress Notes (Signed)
Subjective:    Patient ID: Rita Vasquez, female    DOB: [birth year], 79 y.o.   MRN: [id number]  HPI Reviewed status with Florentina Addison, RN  Doing fairly well Continues to have DOE---walks with rollator Gets SOB if goes up even a slight grade--- or if walks more than 50 feet or so Has occasional mid chest pain--just sitting. Hasn't used the nitro Does note a palpitation at rest at times---never overly fast Uses 2 pillows at night-- no PND Nocturia x 2-3-- no recent change Chronic edema---no major change Still on bid diuretic if weight over 120#  Continues to follow with headache specialist Gabapentin dose being titrated up now Hasn't helped much, or at all Still has daily headaches---but intensity is better (but still 6/10 at times)  Some upper calf pain for ~2 weeks No open areas No injury or new shoes  No change in parotid No pain No swallowing problems  Current Outpatient Prescriptions on File Prior to Visit  Medication Sig Dispense Refill  . aspirin 81 MG tablet Take 81 mg by mouth daily.        . digoxin (LANOXIN) 0.125 MG tablet TAKE (1) TABLET BY MOUTH DAILY FOR HEART.  30 tablet  11  . fluticasone (FLONASE) 50 MCG/ACT nasal spray Place 2 sprays into the nose daily.      . furosemide (LASIX) 20 MG tablet Take 40 mg by mouth daily. And second dose at lunch if AM weight is over 120#      . gabapentin (NEURONTIN) 300 MG capsule Take 1,200 mg by mouth 2 (two) times daily.       . nitroGLYCERIN (NITROSTAT) 0.4 MG SL tablet Place 0.4 mg under the tongue every 5 (five) minutes as needed for chest pain.      . vitamin B-12 (CYANOCOBALAMIN) 1000 MCG tablet Take 1,000 mcg by mouth daily.        . Vitamin D, Ergocalciferol, (DRISDOL) 50000 UNITS CAPS Take 50,000 Units by mouth every 30 (thirty) days.         No current facility-administered medications on file prior to visit.    Allergies  Allergen Reactions  . Coreg     Couldn't tolerate    Past Medical History   Diagnosis Date  . CHF (congestive heart failure)   . Atrial fibrillation   . Non Hodgkin's lymphoma   . Arthritis   . Pulmonary embolism   . Osteoporosis   . Parotid cyst     Past Surgical History  Procedure Laterality Date  . Abdominal hysterectomy    . Cataract extraction    . Appendectomy    . Breast biopsy    . Vertebroplasty    . Mohs surgery      Family History  Problem Relation Age of Onset  . Cancer Sister   . Cancer Brother     History   Social History  . Marital Status: Widowed    Spouse Name: N/A    Number of Children: 3  . Years of Education: N/A   Occupational History  . 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 Smoking status: Former Games developer  . Smokeless tobacco: Never Used  . Alcohol Use: No  . Drug Use: No  . Sexual Activity: Not on file   Other Topics Concern  . Not on file   Social History Narrative   Requests DNR--done 04/03/11   Review of Systems Appetite is down some Generally sleeps  okay bpwels are fine     Objective:   Physical Exam  Constitutional: She appears well-developed and well-nourished. No distress.  Neck: Normal range of motion. Neck supple. No thyromegaly present.  Cardiovascular: Normal rate, normal heart sounds and intact distal pulses.  Exam reveals no gallop.   No murmur heard. irregular  Pulmonary/Chest: Effort normal and breath sounds normal. No respiratory distress. She has no wheezes. She has no rales.  Abdominal: Soft. There is no tenderness.  Musculoskeletal: She exhibits edema. She exhibits no tenderness.  Lymphadenopathy:    She has no cervical adenopathy.  Skin: No rash noted.  Psychiatric: She has a normal mood and affect. Her behavior is normal.  Slight melancholy due to limitations and headache but doesn't seem truly depressed          Assessment & Plan:

## 2013-03-11 NOTE — Assessment & Plan Note (Signed)
NYHA class 3 Stable stamina though Discussed importance of regular exercise--may want to start some resistance exercise also (light weights) Handicapped permit form done Weight up slightly---diuretic doses will increase prn

## 2013-03-11 NOTE — Assessment & Plan Note (Signed)
Ongoing pain Gabapentin being titrated up and then ?trial with another med if this isn't effective Gabapentin does seem to be causing some fatigue and sleepiness

## 2013-05-28 ENCOUNTER — Ambulatory Visit: Payer: Self-pay | Admitting: Podiatry

## 2013-06-04 ENCOUNTER — Ambulatory Visit: Payer: Self-pay | Admitting: Podiatry

## 2013-06-08 ENCOUNTER — Encounter: Payer: Self-pay | Admitting: Podiatry

## 2013-06-08 ENCOUNTER — Ambulatory Visit (INDEPENDENT_AMBULATORY_CARE_PROVIDER_SITE_OTHER): Payer: Medicare Other | Admitting: Podiatry

## 2013-06-08 VITALS — BP 120/60 | HR 56 | Resp 14

## 2013-06-08 DIAGNOSIS — B351 Tinea unguium: Secondary | ICD-10-CM

## 2013-06-08 NOTE — Progress Notes (Signed)
   Subjective:    Patient ID: Rita Vasquez, female    DOB: 08/22/1915, 79 y.o.   MRN: [id number]  HPI Comments: Get toenails cut      Review of Systems     Objective:   Physical Exam        Assessment & Plan:

## 2013-06-08 NOTE — Progress Notes (Signed)
Subjective:     Patient ID: Rita Vasquez, female   DOB: 03/27/1916, 79 y.o.   MRN: [id number]  HPI patient's nails are thick on both feet and impossible for patient to cut   Review of Systems     Objective:   Physical Exam Mycotic nail infection with thickness 1-5 both feet    Assessment:     Mycotic nail infection of both feet    Plan:     Debridement nailbeds 1-5 both feet

## 2013-07-01 ENCOUNTER — Non-Acute Institutional Stay: Payer: Medicare Other | Admitting: Internal Medicine

## 2013-07-01 ENCOUNTER — Encounter: Payer: Self-pay | Admitting: Internal Medicine

## 2013-07-01 VITALS — BP 120/64 | HR 72 | Resp 12 | Wt 121.0 lb

## 2013-07-01 DIAGNOSIS — R51 Headache: Secondary | ICD-10-CM

## 2013-07-01 DIAGNOSIS — I4891 Unspecified atrial fibrillation: Secondary | ICD-10-CM

## 2013-07-01 DIAGNOSIS — I5022 Chronic systolic (congestive) heart failure: Secondary | ICD-10-CM

## 2013-07-01 DIAGNOSIS — K111 Hypertrophy of salivary gland: Secondary | ICD-10-CM

## 2013-07-01 DIAGNOSIS — R519 Headache, unspecified: Secondary | ICD-10-CM

## 2013-07-01 NOTE — Assessment & Plan Note (Signed)
Stable status now NYHA class 3 but getting along okay still in AL No change in meds

## 2013-07-01 NOTE — Assessment & Plan Note (Signed)
Better on the gabapentin but not gone Has follow up later this month

## 2013-07-01 NOTE — Assessment & Plan Note (Signed)
Small and stable  No action needed

## 2013-07-01 NOTE — Progress Notes (Signed)
Subjective:    Patient ID: Rita Vasquez, female    DOB: 03-07-1916, 80 y.o.   MRN: [id number]  HPI Reviewed status with [facility] RN Fairly stable  She notes more SOB with doing "almost nothing" Is able to get down for meals without a problem Slight dyspnea walking up slope and walks to main building daily No chest pain No palpitations Mild edema--may be more noticeable in right at end of day  Sleeps okay Sleeps with 2 pillows No PND Nocturia x 1 is stable  Headaches are "lessened" Still with AM headache at times Now at tolerable levels Has follow up with headache doctor later this month  Still has enlargement of right parotid No pain No change Swallows fine  Current Outpatient Prescriptions on File Prior to Visit  Medication Sig Dispense Refill  . aspirin 81 MG tablet Take 81 mg by mouth daily.        . digoxin (LANOXIN) 0.125 MG tablet TAKE (1) TABLET BY MOUTH DAILY FOR HEART.  30 tablet  11  . fluticasone (FLONASE) 50 MCG/ACT nasal spray Place 2 sprays into the nose daily.      . furosemide (LASIX) 20 MG tablet Take 40 mg by mouth daily. And second dose at lunch if AM weight is over 120#      . gabapentin (NEURONTIN) 300 MG capsule Take 1,200 mg by mouth 2 (two) times daily.       . nitroGLYCERIN (NITROSTAT) 0.4 MG SL tablet Place 0.4 mg under the tongue every 5 (five) minutes as needed for chest pain.      . vitamin B-12 (CYANOCOBALAMIN) 1000 MCG tablet Take 1,000 mcg by mouth daily.        . Vitamin D, Ergocalciferol, (DRISDOL) 50000 UNITS CAPS Take 50,000 Units by mouth every 30 (thirty) days.         No current facility-administered medications on file prior to visit.    Allergies  Allergen Reactions  . Coreg     Couldn't tolerate    Past Medical History  Diagnosis Date  . CHF (congestive heart failure)   . Atrial fibrillation   . Non Hodgkin's lymphoma   . Arthritis   . Pulmonary embolism   . Osteoporosis   . Parotid cyst     Past Surgical  History  Procedure Laterality Date  . Abdominal hysterectomy    . Cataract extraction    . Appendectomy    . Breast biopsy    . Vertebroplasty    . Mohs surgery      Family History  Problem Relation Age of Onset  . Cancer Sister   . Cancer Brother     History   Social History  . Marital Status: Widowed    Spouse Name: N/A    Number of Children: 3  . Years of Education: N/A   Occupational History  . 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 Smoking status: Former Research scientist (life sciences)  . Smokeless tobacco: Never Used  . Alcohol Use: No  . Drug Use: No  . Sexual Activity: Not on file   Other Topics Concern  . Not on file   Social History Narrative   Requests DNR--done 04/03/11   Review of Systems No foot pain Appetite is "poor"--eats because she has to Weight stable Bowels have been fine    Objective:   Physical Exam  Constitutional: She appears well-developed. No distress.  Neck: Normal range of motion.  Neck supple. No thyromegaly present.  Small firm but not tender right parotid  Cardiovascular: Normal rate.  Exam reveals no gallop.   No murmur heard. Irregular Soft systolic murmur  Pulmonary/Chest: Effort normal and breath sounds normal. No respiratory distress. She has no wheezes. She has no rales.  No dullness  Abdominal: Soft. There is no tenderness.  Musculoskeletal: She exhibits no edema.  Thick calves but no real edema  Lymphadenopathy:    She has no cervical adenopathy.  Psychiatric: She has a normal mood and affect. Her behavior is normal.          Assessment & Plan:

## 2013-07-01 NOTE — Assessment & Plan Note (Signed)
Heart rate controlled on dig ASA only

## 2013-09-07 ENCOUNTER — Encounter: Payer: Self-pay | Admitting: Podiatry

## 2013-09-07 ENCOUNTER — Ambulatory Visit (INDEPENDENT_AMBULATORY_CARE_PROVIDER_SITE_OTHER): Payer: Medicare Other | Admitting: Podiatry

## 2013-09-07 VITALS — BP 117/51 | HR 63 | Resp 12

## 2013-09-07 DIAGNOSIS — B351 Tinea unguium: Secondary | ICD-10-CM

## 2013-09-08 NOTE — Progress Notes (Signed)
Subjective:     Patient ID: Rita Vasquez, female   DOB: Jul 08, 1915, 80 y.o.   MRN: [id number]  HPI patient presents with thick nails 1-5 both the   Review of Systems     Objective:   Physical Exam Neurovascular status and checked with nail disease 1-5 both feet thick    Assessment:     Mycotic nail infection 1-5 both feet    Plan:     Debris nails 1-5 both feet no bleeding noted

## 2013-10-07 ENCOUNTER — Encounter: Payer: Self-pay | Admitting: Internal Medicine

## 2013-10-07 ENCOUNTER — Non-Acute Institutional Stay: Payer: Medicare Other | Admitting: Internal Medicine

## 2013-10-07 VITALS — BP 120/60 | HR 55 | Temp 97.6°F | Resp 14 | Wt 121.0 lb

## 2013-10-07 DIAGNOSIS — R519 Headache, unspecified: Secondary | ICD-10-CM

## 2013-10-07 DIAGNOSIS — R51 Headache: Secondary | ICD-10-CM

## 2013-10-07 DIAGNOSIS — I5022 Chronic systolic (congestive) heart failure: Secondary | ICD-10-CM

## 2013-10-07 DIAGNOSIS — I4891 Unspecified atrial fibrillation: Secondary | ICD-10-CM

## 2013-10-07 DIAGNOSIS — K111 Hypertrophy of salivary gland: Secondary | ICD-10-CM

## 2013-10-07 DIAGNOSIS — Z87898 Personal history of other specified conditions: Secondary | ICD-10-CM

## 2013-10-07 NOTE — Assessment & Plan Note (Signed)
No evidence of recurrence (doubt stable parotid enlargement could be from lymphoma)

## 2013-10-07 NOTE — Progress Notes (Signed)
Subjective:    Patient ID: Rita Vasquez, female    DOB: 04/10/1916, 80 y.o.   MRN: [id number]  HPI Doing okay Recent visit with Dr Nehemiah Massed went fine  Still notes pain in both upper calves--notes it when sitting May notice when walking but doesn't get worse Knees don't hurt Fairly quick--so doesn't use meds  Stable DOE-- can walk at least 100-200 feet. Has had to shorten walk though--can't go all the way to health care (which is much longer than this) No chest pain Occasional palpitations when sitting--no skips or fast beat, just aware of it  Right parotid gland is about the same No pain No trouble swallowing  Still has the headaches May be less frequent but haven't gone aware Follows with Dr Domingo Cocking  Current Outpatient Prescriptions on File Prior to Visit  Medication Sig Dispense Refill  . aspirin 81 MG tablet Take 81 mg by mouth daily.        . digoxin (LANOXIN) 0.125 MG tablet TAKE (1) TABLET BY MOUTH DAILY FOR HEART.  30 tablet  11  . fluticasone (FLONASE) 50 MCG/ACT nasal spray Place 2 sprays into the nose daily.      . furosemide (LASIX) 20 MG tablet Take 40 mg by mouth daily. And second dose at lunch if AM weight is over 120#      . gabapentin (NEURONTIN) 300 MG capsule Take 1,200 mg by mouth 2 (two) times daily.       . nitroGLYCERIN (NITROSTAT) 0.4 MG SL tablet Place 0.4 mg under the tongue every 5 (five) minutes as needed for chest pain.      . vitamin B-12 (CYANOCOBALAMIN) 1000 MCG tablet Take 1,000 mcg by mouth daily.        . Vitamin D, Ergocalciferol, (DRISDOL) 50000 UNITS CAPS Take 50,000 Units by mouth every 30 (thirty) days.         No current facility-administered medications on file prior to visit.    Allergies  Allergen Reactions  . Coreg     Couldn't tolerate    Past Medical History  Diagnosis Date  . CHF (congestive heart failure)   . Atrial fibrillation   . Non Hodgkin's lymphoma   . Arthritis   . Pulmonary embolism   .  Osteoporosis   . Parotid cyst     Past Surgical History  Procedure Laterality Date  . Abdominal hysterectomy    . Cataract extraction    . Appendectomy    . Breast biopsy    . Vertebroplasty    . Mohs surgery      Family History  Problem Relation Age of Onset  . Cancer Sister   . Cancer Brother     History   Social History  . Marital Status: Widowed    Spouse Name: N/A    Number of Children: 3  . Years of Education: N/A   Occupational History  . 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 Smoking status: Former Research scientist (life sciences)  . Smokeless tobacco: Never Used  . Alcohol Use: No  . Drug Use: No  . Sexual Activity: Not on file   Other Topics Concern  . Not on file   Social History Narrative   Requests DNR--done 04/03/11   Review of Systems Appetite is poor-- "but I eat" Weight is stable Bowels are regular Sleeps fine     Objective:   Physical Exam  Constitutional: She is oriented to person, place,  and time. She appears well-developed and well-nourished. No distress.  Neck: Normal range of motion. Neck supple. No thyromegaly present.  Small firm right parotid is unchanged and without inflammation or tenderness  Cardiovascular: Normal rate.  Exam reveals no gallop.   Irregular Soft systolic murmur along the left sternal border  Abdominal: Soft. There is no tenderness.  Musculoskeletal: She exhibits no tenderness.  Thick calves but no pitting  Lymphadenopathy:    She has no cervical adenopathy.  Neurological: She is alert and oriented to person, place, and time.  Psychiatric: She has a normal mood and affect. Her behavior is normal.          Assessment & Plan:

## 2013-10-07 NOTE — Assessment & Plan Note (Signed)
Stable No tenderness

## 2013-10-07 NOTE — Assessment & Plan Note (Signed)
Persists but controlled on the gabapentin

## 2013-10-07 NOTE — Assessment & Plan Note (Signed)
Rate controlled on dig ASA only

## 2013-10-07 NOTE — Assessment & Plan Note (Signed)
Stable NYHA class 3 symptoms Neutral fluid status on diuretic regimen

## 2013-10-27 ENCOUNTER — Telehealth: Payer: Self-pay

## 2013-10-27 ENCOUNTER — Other Ambulatory Visit: Payer: Self-pay | Admitting: Internal Medicine

## 2013-10-27 MED ORDER — DIPHENHYDRAMINE-MENTHOL 2-1 % EX LIQD: 1.0000 "application " | Freq: Every day | CUTANEOUS | Status: DC | PRN

## 2013-10-27 NOTE — Telephone Encounter (Signed)
[place] pharmacist from [facility] left v/m;[facility] is not familiar with Diphenhydramine- Menthol compound;Mandy at [facility] thought pt was to get Dermarest shampoo and conditioner. Ranger request cb.

## 2013-10-27 NOTE — Telephone Encounter (Signed)
Spoke with pharmacare and they will dispense Dermarest shampoo and conditioner

## 2013-10-27 NOTE — Telephone Encounter (Signed)
That was the only demarest I saw  Okay to dispense the shampoo for her Use as per usual directions for product (per pharmacist)

## 2013-11-03 ENCOUNTER — Other Ambulatory Visit: Payer: Self-pay | Admitting: Internal Medicine

## 2013-12-01 ENCOUNTER — Encounter: Payer: Self-pay | Admitting: Internal Medicine

## 2013-12-10 ENCOUNTER — Ambulatory Visit (INDEPENDENT_AMBULATORY_CARE_PROVIDER_SITE_OTHER): Payer: 59 | Admitting: Podiatry

## 2013-12-10 VITALS — BP 118/57 | HR 53 | Resp 16

## 2013-12-10 DIAGNOSIS — B351 Tinea unguium: Secondary | ICD-10-CM

## 2013-12-11 NOTE — Progress Notes (Signed)
Subjective:     Patient ID: Rita Vasquez, female   DOB: 1916/01/29, 80 y.o.   MRN: [id number]  HPI patient presents with thick nailbeds 1-5 both feet that she cannot cut   Review of Systems     Objective:   Physical Exam Neurovascular status unchanged with thick yellow brittle nailbeds 1-5 both feet that are nontender    Assessment:     Mycotic nail infection 1-5 both feet    Plan:     Debridement nailbeds 1-5 both feet with no bleeding noted

## 2013-12-16 ENCOUNTER — Encounter: Payer: Self-pay | Admitting: Internal Medicine

## 2013-12-21 ENCOUNTER — Other Ambulatory Visit: Payer: Self-pay | Admitting: *Deleted

## 2013-12-21 MED ORDER — POTASSIUM CHLORIDE CRYS ER 20 MEQ PO TBCR: 20.0000 meq | EXTENDED_RELEASE_TABLET | Freq: Every day | ORAL | Status: AC

## 2014-01-13 ENCOUNTER — Non-Acute Institutional Stay: Payer: Medicare Other | Admitting: Internal Medicine

## 2014-01-13 ENCOUNTER — Encounter: Payer: Self-pay | Admitting: Internal Medicine

## 2014-01-13 VITALS — BP 128/60 | HR 58 | Temp 97.7°F | Resp 14 | Wt 118.0 lb

## 2014-01-13 DIAGNOSIS — I5022 Chronic systolic (congestive) heart failure: Secondary | ICD-10-CM

## 2014-01-13 DIAGNOSIS — I4891 Unspecified atrial fibrillation: Secondary | ICD-10-CM

## 2014-01-13 DIAGNOSIS — I482 Chronic atrial fibrillation, unspecified: Secondary | ICD-10-CM

## 2014-01-13 DIAGNOSIS — K111 Hypertrophy of salivary gland: Secondary | ICD-10-CM

## 2014-01-13 DIAGNOSIS — R51 Headache: Secondary | ICD-10-CM

## 2014-01-13 DIAGNOSIS — R519 Headache, unspecified: Secondary | ICD-10-CM

## 2014-01-13 NOTE — Progress Notes (Signed)
Subjective:    Patient ID: Rita Vasquez, female    DOB: 11-16-1915, 80 y.o.   MRN: [id number]  HPI Reviewed status with [facility] RN No new concerns  She feels she is doing okay Does get occasional sensation of skipping--but not racing No recent chest pain--- hasn't needed nitro Has ongoing calf edema---left more prominent. Stays even in AM Breathing is about the same---will get DOE with lifting or extended walking (like going to health care---has to rest along the way) Wears the oxygen all the time  Right parotid larger again She feels a new lump there No pain No swallowing problems No sores in mouth  Still has the headaches No longer awakens with headache--now comes and goes during day Severity is not bad Hasn't used the extra gabapentin  Recent derm visit ---constant sore tongue No lesions seen--- ?nerve pain  Current Outpatient Prescriptions on File Prior to Visit  Medication Sig Dispense Refill  . aspirin 81 MG tablet Take 81 mg by mouth daily.        . digoxin (LANOXIN) 0.125 MG tablet TAKE (1) TABLET BY MOUTH DAILY FOR HEART.  30 tablet  11  . fluticasone (FLONASE) 50 MCG/ACT nasal spray Place 2 sprays into the nose daily.      Marland Kitchen gabapentin (NEURONTIN) 300 MG capsule Take 1,200 mg by mouth 2 (two) times daily.       . nitroGLYCERIN (NITROSTAT) 0.4 MG SL tablet Place 0.4 mg under the tongue every 5 (five) minutes as needed for chest pain.      Marland Kitchen nystatin (MYCOSTATIN) 100000 UNIT/ML suspension USE 5 MLS TO SWISH AND SPIT FOUR TIMES A DAY *LEAVE IN MOUTH AS LONG AS POSSIBLE - USE FOR 48HR AFTER SYMPTOMS RESOLVE*  150 mL  0  . potassium chloride SA (K-DUR,KLOR-CON) 20 MEQ tablet Take 1 tablet (20 mEq total) by mouth daily.  30 tablet  11  . vitamin B-12 (CYANOCOBALAMIN) 1000 MCG tablet Take 1,000 mcg by mouth daily.        . Vitamin D, Ergocalciferol, (DRISDOL) 50000 UNITS CAPS Take 50,000 Units by mouth every 30 (thirty) days.         No current  facility-administered medications on file prior to visit.    Allergies  Allergen Reactions  . Coreg     Couldn't tolerate    Past Medical History  Diagnosis Date  . CHF (congestive heart failure)   . Atrial fibrillation   . Non Hodgkin's lymphoma   . Arthritis   . Pulmonary embolism   . Osteoporosis   . Parotid cyst     Past Surgical History  Procedure Laterality Date  . Abdominal hysterectomy    . Cataract extraction    . Appendectomy    . Breast biopsy    . Vertebroplasty    . Mohs surgery      Family History  Problem Relation Age of Onset  . Cancer Sister   . Cancer Brother     History   Social History  . Marital Status: Widowed    Spouse Name: N/A    Number of Children: 3  . Years of Education: N/A   Occupational History  . 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 Smoking status: Former Research scientist (life sciences)  . Smokeless tobacco: Never Used  . Alcohol Use: No  . Drug Use: No  . Sexual Activity: Not on file   Other Topics Concern  . Not on  file   Social History Narrative   Requests DNR--done 04/03/11   Review of Systems Still not much appetite but eats about the same Weight slightly down Sleeps fine Bowels are good Occasional stress incontinence---wears pad     Objective:   Physical Exam  Constitutional: She appears well-developed. No distress.  Neck: Normal range of motion. Neck supple. No thyromegaly present.  Right parotid is larger but no tenderness, inflammation or focal masses  Cardiovascular: Normal rate.  Exam reveals no gallop.   Rate over 60 now Irregular Gr 2/6 coarse aortic mumur louder in the base  Pulmonary/Chest: Effort normal. No respiratory distress. She has no wheezes. She has no rales.  Decreased breath sounds but clear  Abdominal: Soft. She exhibits no distension. There is no tenderness. There is no rebound and no guarding.  Musculoskeletal: She exhibits no edema and no tenderness.  Calves full  but without clear cut edema  Lymphadenopathy:    She has no cervical adenopathy.  Psychiatric: She has a normal mood and affect. Her behavior is normal.          Assessment & Plan:

## 2014-01-13 NOTE — Assessment & Plan Note (Signed)
Persists but intensity is less severe and she can tolerate Continue the gabapentin

## 2014-01-13 NOTE — Assessment & Plan Note (Signed)
Larger but no other symptoms Had full work up including biopsy and MRI in past---so no further action

## 2014-01-13 NOTE — Assessment & Plan Note (Signed)
Compensated Stable NYHA class 2 status No changes needed

## 2014-01-13 NOTE — Assessment & Plan Note (Signed)
Rate is well controlled ASA only due to bleeding risk

## 2014-02-04 ENCOUNTER — Encounter: Payer: Self-pay | Admitting: Podiatry

## 2014-02-04 ENCOUNTER — Ambulatory Visit (INDEPENDENT_AMBULATORY_CARE_PROVIDER_SITE_OTHER): Payer: 59 | Admitting: Podiatry

## 2014-02-04 DIAGNOSIS — B351 Tinea unguium: Secondary | ICD-10-CM

## 2014-02-04 DIAGNOSIS — M79609 Pain in unspecified limb: Secondary | ICD-10-CM

## 2014-02-04 DIAGNOSIS — M79676 Pain in unspecified toe(s): Secondary | ICD-10-CM

## 2014-02-05 ENCOUNTER — Emergency Department: Payer: Self-pay | Admitting: Emergency Medicine

## 2014-02-05 LAB — CBC WITH DIFFERENTIAL/PLATELET
Basophil #: 0 10*3/uL (ref 0.0–0.1)
Basophil %: 0.5 %
Eosinophil #: 0.1 10*3/uL (ref 0.0–0.7)
Eosinophil %: 0.8 %
HCT: 40.4 % (ref 35.0–47.0)
HGB: 13 g/dL (ref 12.0–16.0)
LYMPHS PCT: 37.3 %
Lymphocyte #: 2.4 10*3/uL (ref 1.0–3.6)
MCH: 29.2 pg (ref 26.0–34.0)
MCHC: 32.3 g/dL (ref 32.0–36.0)
MCV: 91 fL (ref 80–100)
MONO ABS: 0.5 x10 3/mm (ref 0.2–0.9)
Monocyte %: 8 %
NEUTROS ABS: 3.5 10*3/uL (ref 1.4–6.5)
NEUTROS PCT: 53.4 %
Platelet: 127 10*3/uL — ABNORMAL LOW (ref 150–440)
RBC: 4.46 10*6/uL (ref 3.80–5.20)
RDW: 14.9 % — AB (ref 11.5–14.5)
WBC: 6.5 10*3/uL (ref 3.6–11.0)

## 2014-02-05 LAB — BASIC METABOLIC PANEL
Anion Gap: 5 — ABNORMAL LOW (ref 7–16)
BUN: 31 mg/dL — ABNORMAL HIGH (ref 7–18)
CALCIUM: 8.6 mg/dL (ref 8.5–10.1)
CO2: 33 mmol/L — AB (ref 21–32)
CREATININE: 1.19 mg/dL (ref 0.60–1.30)
Chloride: 101 mmol/L (ref 98–107)
EGFR (African American): 45 — ABNORMAL LOW
EGFR (Non-African Amer.): 39 — ABNORMAL LOW
GLUCOSE: 99 mg/dL (ref 65–99)
OSMOLALITY: 284 (ref 275–301)
POTASSIUM: 4.2 mmol/L (ref 3.5–5.1)
Sodium: 139 mmol/L (ref 136–145)

## 2014-02-05 LAB — URINALYSIS, COMPLETE
BILIRUBIN, UR: NEGATIVE
Glucose,UR: NEGATIVE mg/dL (ref 0–75)
KETONE: NEGATIVE
Nitrite: POSITIVE
Ph: 6 (ref 4.5–8.0)
Protein: NEGATIVE
RBC,UR: 1 /HPF (ref 0–5)
SPECIFIC GRAVITY: 1.014 (ref 1.003–1.030)
Squamous Epithelial: NONE SEEN
WBC UR: 2 /HPF (ref 0–5)

## 2014-02-05 LAB — DIGOXIN LEVEL: DIGOXIN: 0.14 ng/mL

## 2014-02-05 LAB — TROPONIN I: Troponin-I: 0.03 ng/mL

## 2014-02-06 NOTE — Progress Notes (Signed)
Subjective:     Patient ID: Rita Vasquez, female   DOB: October 07, 1915, 80 y.o.   MRN: [id number]  HPI patient presents with thick nails 1-5 of both feet   Review of Systems     Objective:   Physical Exam Neurovascular status intact with thick nailbeds 1-5 both feet    Assessment:     Mycotic nail infection is with pain 1-5 both feet    Plan:     Debride painful nailbeds 1-5 of both feet

## 2014-02-07 LAB — URINE CULTURE

## 2014-03-11 ENCOUNTER — Other Ambulatory Visit: Payer: 59

## 2014-03-17 ENCOUNTER — Ambulatory Visit: Payer: Self-pay | Admitting: Internal Medicine

## 2014-03-18 ENCOUNTER — Encounter: Payer: Self-pay | Admitting: Internal Medicine

## 2014-03-18 ENCOUNTER — Non-Acute Institutional Stay: Payer: Medicare Other | Admitting: Internal Medicine

## 2014-03-18 VITALS — BP 110/62 | HR 91 | Temp 97.5°F | Resp 18 | Wt 118.0 lb

## 2014-03-18 DIAGNOSIS — B9689 Other specified bacterial agents as the cause of diseases classified elsewhere: Secondary | ICD-10-CM

## 2014-03-18 DIAGNOSIS — A499 Bacterial infection, unspecified: Secondary | ICD-10-CM

## 2014-03-18 DIAGNOSIS — J329 Chronic sinusitis, unspecified: Secondary | ICD-10-CM

## 2014-03-18 NOTE — Progress Notes (Signed)
Subjective:    Patient ID: Rita Vasquez, female    DOB: [birth year], 80 y.o.   MRN: [id number]  HPI  Asked to visit resident in ALF rm 209 for 6 days nasal congestion, weakness and shortness of breath. Had decreased saturations to 90 % on 2 L O2. Denies chest pain. Is blowing green mucous out of her nose. Does c/o headache and fatigue, sore throat. Had been taking claritin and flonase without any relief. No fevers noted.  Review of Systems  Past Medical History  Diagnosis Date  . CHF (congestive heart failure)   . Atrial fibrillation   . Non Hodgkin's lymphoma   . Arthritis   . Pulmonary embolism   . Osteoporosis   . Parotid cyst   . Oxygen deficiency     Current Outpatient Prescriptions  Medication Sig Dispense Refill  . aspirin 81 MG tablet Take 81 mg by mouth daily.        . digoxin (LANOXIN) 0.125 MG tablet TAKE (1) TABLET BY MOUTH DAILY FOR HEART.  30 tablet  11  . fluticasone (FLONASE) 50 MCG/ACT nasal spray Place 2 sprays into the nose daily.      . furosemide (LASIX) 40 MG tablet Take 40 mg by mouth 2 (two) times daily.      Marland Kitchen gabapentin (NEURONTIN) 300 MG capsule Take 1,200 mg by mouth 2 (two) times daily.       . nitroGLYCERIN (NITROSTAT) 0.4 MG SL tablet Place 0.4 mg under the tongue every 5 (five) minutes as needed for chest pain.      Marland Kitchen nystatin (MYCOSTATIN) 100000 UNIT/ML suspension USE 5 MLS TO SWISH AND SPIT FOUR TIMES A DAY *LEAVE IN MOUTH AS LONG AS POSSIBLE - USE FOR 48HR AFTER SYMPTOMS RESOLVE*  150 mL  0  . potassium chloride SA (K-DUR,KLOR-CON) 20 MEQ tablet Take 1 tablet (20 mEq total) by mouth daily.  30 tablet  11  . vitamin B-12 (CYANOCOBALAMIN) 1000 MCG tablet Take 1,000 mcg by mouth daily.        . Vitamin D, Ergocalciferol, (DRISDOL) 50000 UNITS CAPS Take 50,000 Units by mouth every 30 (thirty) days.        Marland Kitchen gabapentin (NEURONTIN) 100 MG capsule Take 1,200 mg by mouth 2 (two) times daily. For headache       No current facility-administered  medications for this visit.    Allergies  Allergen Reactions  . Coreg     Couldn't tolerate    Family History  Problem Relation Age of Onset  . Cancer Sister   . Cancer Brother     History   Social History  . Marital Status: Married    Spouse Name: N/A    Number of Children: 3  . Years of Education: N/A   Occupational History  . 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 Smoking status: Former Research scientist (life sciences)  . Smokeless tobacco: Never Used  . Alcohol Use: No  . Drug Use: No  . Sexual Activity: Not on file   Other Topics Concern  . Not on file   Social History Narrative   Requests DNR--done 04/03/11     Constitutional: Pt reports fatigue and headache. Denies feve or abrupt weight changes.  HEENT: Pt reports nasal congestion and sore throat. Denies eye pain, eye redness, ear pain, ringing in the ears, wax buildup, runny nose, bloody nose. Respiratory: Pt reports shortness of breath. Denies difficulty breathing,  cough or  sputum production.   Cardiovascular: Denies chest pain, chest tightness, palpitations or swelling in the hands or feet.  Gastrointestinal: Denies abdominal pain, bloating, constipation, diarrhea or blood in the stool.  GU: Denies urgency, frequency, pain with urination, burning sensation, blood in urine, odor or discharge.  No other specific complaints in a complete review of systems (except as listed in HPI above).     Objective:   Physical Exam  BP 110/62  Pulse 91  Temp(Src) 97.5 F (36.4 C)  Resp 18  Wt 118 lb (53.524 kg)  SpO2 99% Wt Readings from Last 3 Encounters:  03/18/14 118 lb (53.524 kg)  01/13/14 118 lb (53.524 kg)  10/07/13 121 lb (54.885 kg)    General: Appears her stated age, ill appearing in NAD. HEENT: Frontal tenderness noted.  Nose: mucosa pink and moist, septum midline; Throat/Mouth: Teeth present, mucosa erythematous and moist, no exudate, lesions or ulcerations noted. .  Cardiovascular:  Normal rate with irregular rhythm. S1,S2 noted.  No murmur, rubs or gallops noted. No JVD or BLE edema. No carotid bruits noted. Pulmonary/Chest: Normal effort and positive vesicular breath sounds. No respiratory distress. No wheezes, rales or ronchi noted.    BMET    Component Value Date/Time   NA 134* 01/29/2012 1126   K 4.5 01/29/2012 1126   CL 93* 01/29/2012 1126   CO2 31 01/29/2012 1126   GLUCOSE 95 01/29/2012 1126   BUN 18 01/29/2012 1126   CREATININE 0.7 01/29/2012 1126   CALCIUM 10.3 01/29/2012 1126   GFRNONAA 66.49 05/16/2010 1336    Lipid Panel  No results found for this basename: chol, trig, hdl, cholhdl, vldl, ldlcalc    CBC    Component Value Date/Time   WBC 7.9 01/29/2012 1126   RBC 4.42 01/29/2012 1126   HGB 14.5 01/29/2012 1126   HCT 43.9 01/29/2012 1126   PLT 161.0 01/29/2012 1126   MCV 99.3 01/29/2012 1126   MCHC 33.0 01/29/2012 1126   RDW 13.4 01/29/2012 1126   LYMPHSABS 1.6 01/29/2012 1126   MONOABS 0.6 01/29/2012 1126   EOSABS 0.1 01/29/2012 1126   BASOSABS 0.0 01/29/2012 1126    Hgb A1C No results found for this basename: HGBA1C         Assessment & Plan:   Bacterial Sinusitis:  CXRAY was done to r/o pneumonia- negative Already started on Augmentin BID x 10 days- continue Continue claritin and flonase  Will follow up as needed

## 2014-03-22 ENCOUNTER — Telehealth: Payer: Self-pay

## 2014-03-22 ENCOUNTER — Inpatient Hospital Stay: Payer: Self-pay | Admitting: Internal Medicine

## 2014-03-22 LAB — COMPREHENSIVE METABOLIC PANEL
Albumin: 3.4 g/dL (ref 3.4–5.0)
Alkaline Phosphatase: 42 U/L — ABNORMAL LOW
Anion Gap: 7 (ref 7–16)
BUN: 18 mg/dL (ref 7–18)
Bilirubin,Total: 0.3 mg/dL (ref 0.2–1.0)
CHLORIDE: 101 mmol/L (ref 98–107)
Calcium, Total: 8.5 mg/dL (ref 8.5–10.1)
Co2: 27 mmol/L (ref 21–32)
Creatinine: 0.94 mg/dL (ref 0.60–1.30)
EGFR (Non-African Amer.): 59 — ABNORMAL LOW
GLUCOSE: 110 mg/dL — AB (ref 65–99)
Osmolality: 273 (ref 275–301)
POTASSIUM: 3.9 mmol/L (ref 3.5–5.1)
SGOT(AST): 29 U/L (ref 15–37)
SGPT (ALT): 20 U/L
SODIUM: 135 mmol/L — AB (ref 136–145)
Total Protein: 7.1 g/dL (ref 6.4–8.2)

## 2014-03-22 LAB — URINALYSIS, COMPLETE
BILIRUBIN, UR: NEGATIVE
Bacteria: NONE SEEN
GLUCOSE, UR: NEGATIVE mg/dL (ref 0–75)
Hyaline Cast: 6
Ketone: NEGATIVE
LEUKOCYTE ESTERASE: NEGATIVE
NITRITE: NEGATIVE
PH: 5 (ref 4.5–8.0)
PROTEIN: NEGATIVE
Specific Gravity: 1.008 (ref 1.003–1.030)
Squamous Epithelial: <1

## 2014-03-22 LAB — CK-MB: CK-MB: 1.5 ng/mL (ref 0.5–3.6)

## 2014-03-22 LAB — PROTIME-INR
INR: 1
Prothrombin Time: 13.4 secs (ref 11.5–14.7)

## 2014-03-22 LAB — CBC
HCT: 42 % (ref 35.0–47.0)
HGB: 13.1 g/dL (ref 12.0–16.0)
MCH: 28.1 pg (ref 26.0–34.0)
MCHC: 31.3 g/dL — AB (ref 32.0–36.0)
MCV: 90 fL (ref 80–100)
Platelet: 140 10*3/uL — ABNORMAL LOW (ref 150–440)
RBC: 4.68 10*6/uL (ref 3.80–5.20)
RDW: 14.4 % (ref 11.5–14.5)
WBC: 6.9 10*3/uL (ref 3.6–11.0)

## 2014-03-22 LAB — PRO B NATRIURETIC PEPTIDE: B-Type Natriuretic Peptide: 1102 pg/mL — ABNORMAL HIGH (ref 0–450)

## 2014-03-22 LAB — DIGOXIN LEVEL: Digoxin: <0.1 ng/mL — ABNORMAL LOW

## 2014-03-22 LAB — TSH: THYROID STIMULATING HORM: 2.03 u[IU]/mL

## 2014-03-22 LAB — TROPONIN I
TROPONIN-I: 0.07 ng/mL — AB
Troponin-I: <0.02 ng/mL

## 2014-03-22 NOTE — Telephone Encounter (Signed)
Rita Vasquez with independent living at [facility] called and pt has started with pulse rate 150-170;hands shaky, severe h/a and cannot move legs. pts daughter is presently with her; Joellen Jersey thinks pt should go for eval at ED. I asked Joellen Jersey if she had contacted Dr Silvio Pate and she said that was why she was calling the office. Dr Silvio Pate and Webb Silversmith NP not here; advised to transport pt to ED for eval. Spoke with Dr Glori Bickers and she advised if Dr Silvio Pate is not at [facility] to send pt to ED. Carrie advised Dr Silvio Pate not at [facility].

## 2014-03-23 LAB — BASIC METABOLIC PANEL
Anion Gap: 4 — ABNORMAL LOW (ref 7–16)
BUN: 17 mg/dL (ref 7–18)
CALCIUM: 7.6 mg/dL — AB (ref 8.5–10.1)
CHLORIDE: 107 mmol/L (ref 98–107)
Co2: 29 mmol/L (ref 21–32)
Creatinine: 0.86 mg/dL (ref 0.60–1.30)
Glucose: 102 mg/dL — ABNORMAL HIGH (ref 65–99)
OSMOLALITY: 281 (ref 275–301)
Potassium: 3.5 mmol/L (ref 3.5–5.1)
SODIUM: 140 mmol/L (ref 136–145)

## 2014-03-23 LAB — CBC WITH DIFFERENTIAL/PLATELET
BASOS PCT: 0.3 %
Basophil #: 0 10*3/uL (ref 0.0–0.1)
Eosinophil #: 0.1 10*3/uL (ref 0.0–0.7)
Eosinophil %: 1.6 %
HCT: 35.1 % (ref 35.0–47.0)
HGB: 11.3 g/dL — AB (ref 12.0–16.0)
LYMPHS PCT: 50.7 %
Lymphocyte #: 2.9 10*3/uL (ref 1.0–3.6)
MCH: 28.7 pg (ref 26.0–34.0)
MCHC: 32.2 g/dL (ref 32.0–36.0)
MCV: 89 fL (ref 80–100)
Monocyte #: 0.5 x10 3/mm (ref 0.2–0.9)
Monocyte %: 8.7 %
Neutrophil #: 2.2 10*3/uL (ref 1.4–6.5)
Neutrophil %: 38.7 %
Platelet: 98 10*3/uL — ABNORMAL LOW (ref 150–440)
RBC: 3.94 10*6/uL (ref 3.80–5.20)
RDW: 14.2 % (ref 11.5–14.5)
WBC: 5.7 10*3/uL (ref 3.6–11.0)

## 2014-03-23 LAB — TROPONIN I
Troponin-I: 0.1 ng/mL — ABNORMAL HIGH
Troponin-I: 0.06 ng/mL — ABNORMAL HIGH
Troponin-I: 0.06 ng/mL — ABNORMAL HIGH

## 2014-03-23 LAB — CK-MB: CK-MB: 1.5 ng/mL (ref 0.5–3.6)

## 2014-03-23 NOTE — Telephone Encounter (Signed)
I actually was at [facility] but not at assisted living. No matter what, with her cardiac history, tachycardia like that required emergent ER evaluation

## 2014-03-24 LAB — MAGNESIUM: Magnesium: 1.6 mg/dL — ABNORMAL LOW

## 2014-03-28 DIAGNOSIS — I4891 Unspecified atrial fibrillation: Secondary | ICD-10-CM

## 2014-03-28 DIAGNOSIS — R51 Headache: Secondary | ICD-10-CM

## 2014-03-28 DIAGNOSIS — H8149 Vertigo of central origin, unspecified ear: Secondary | ICD-10-CM

## 2014-03-30 DIAGNOSIS — I951 Orthostatic hypotension: Secondary | ICD-10-CM

## 2014-03-30 DIAGNOSIS — I482 Chronic atrial fibrillation: Secondary | ICD-10-CM

## 2014-03-30 DIAGNOSIS — R51 Headache: Secondary | ICD-10-CM

## 2014-03-30 DIAGNOSIS — I5022 Chronic systolic (congestive) heart failure: Secondary | ICD-10-CM

## 2014-04-17 ENCOUNTER — Ambulatory Visit: Payer: Self-pay | Admitting: Internal Medicine

## 2014-04-18 DIAGNOSIS — I5022 Chronic systolic (congestive) heart failure: Secondary | ICD-10-CM

## 2014-04-18 DIAGNOSIS — I482 Chronic atrial fibrillation: Secondary | ICD-10-CM

## 2014-04-18 DIAGNOSIS — R51 Headache: Secondary | ICD-10-CM

## 2014-04-21 DIAGNOSIS — M25051 Hemarthrosis, right hip: Secondary | ICD-10-CM

## 2014-05-03 ENCOUNTER — Encounter: Payer: Self-pay | Admitting: Internal Medicine

## 2014-05-06 ENCOUNTER — Ambulatory Visit (INDEPENDENT_AMBULATORY_CARE_PROVIDER_SITE_OTHER): Payer: 59 | Admitting: Podiatry

## 2014-05-06 DIAGNOSIS — B351 Tinea unguium: Secondary | ICD-10-CM | POA: Diagnosis not present

## 2014-05-06 DIAGNOSIS — M79676 Pain in unspecified toe(s): Secondary | ICD-10-CM

## 2014-05-06 NOTE — Progress Notes (Signed)
Subjective:     Patient ID: Rita Vasquez, female   DOB: 06-08-1916, 80 y.o.   MRN: [id number]  HPI patient presents with thick nails 1-5 of both feet   Review of Systems     Objective:   Physical Exam Neurovascular status intact with thick nailbeds 1-5 both feet    Assessment:     Mycotic nail infection is with pain 1-5 both feet    Plan:     Debride painful nailbeds 1-5 of both feet

## 2014-06-02 ENCOUNTER — Ambulatory Visit (INDEPENDENT_AMBULATORY_CARE_PROVIDER_SITE_OTHER): Payer: 59 | Admitting: Podiatry

## 2014-06-02 VITALS — BP 118/57 | HR 53 | Resp 16

## 2014-06-02 DIAGNOSIS — L600 Ingrowing nail: Secondary | ICD-10-CM

## 2014-06-02 DIAGNOSIS — B351 Tinea unguium: Secondary | ICD-10-CM | POA: Diagnosis not present

## 2014-06-02 DIAGNOSIS — M79674 Pain in right toe(s): Secondary | ICD-10-CM

## 2014-06-02 NOTE — Patient Instructions (Signed)
Monitor for any signs/symptoms of infection. Call the office immediately if any occur or go directly to the emergency room. Call with any questions/concerns.  

## 2014-06-02 NOTE — Progress Notes (Signed)
Patient ID: Rita Vasquez, female   DOB: 1915/11/01, 80 y.o.   MRN: [id number]  Subjective: 80 year old female returns the office today for complaints of right big toe nail pain. She states the nail is thick and elongated and is starting to ingrowing to the skin. She denies any recent redness or drainage from around the nail sites. The nail is particularly painful with shoe gear. Denies any systemic complaints as fevers, chills, nausea, vomiting. No other complaints at this time in no acute changes since last appointment.  Objective: AAO 3, NAD DP/PT pulses palpable bilaterally, CRT less than 3 seconds Protective sensation appears to be intact with Derrel Nip monofilament, vibratory sensation intact. Right hallux nails hypertrophic, dystrophic, elongated, brittle, discolored with incurvation of both the medial and lateral nail borders distally. There is no pain along the proximal nail borders. There is no swelling erythema or drainage from the nail. No clinical signs of infection. Remaining nails are also hypertrophic, dystrophic, elongated, discolored, brittle. No swelling erythema or drainage from the remaining nail sites. No open lesions or pre-ulcerative lesions. Varicose veins bilateral lower chemise. No pain with calf compression, swelling, warmth, erythema.  Assessment: 80 year old female with syndesmotic onychomycosis, ingrown toenail  Plan: -Treatment options were discussed the patient including alternatives, risks, complications. -Right hallux toenail sharply debrided to patient comfort. The ingrown portion of the toenail was removed. Upon debridement of the nail there is resolution of symptoms. Continue to monitor the area for any clinical signs or symptoms of infection or any reoccurrence. -Remaining nails were also starting debrided without complications. -Discussed the importance of daily foot inspection. -Follow-up as scheduled. In the meantime, call the office with  any questions, concerns, change in symptoms.

## 2014-06-20 ENCOUNTER — Telehealth: Payer: Self-pay

## 2014-06-20 NOTE — Telephone Encounter (Signed)
Please check with Leafy Ro to see how she is doing

## 2014-06-20 NOTE — Telephone Encounter (Signed)
Left VM for Mandy and asked her to return my call

## 2014-06-20 NOTE — Telephone Encounter (Signed)
PLEASE NOTE: All timestamps contained within this report are represented as [place] Standard Time. CONFIDENTIALTY NOTICE: This fax transmission is intended only for the addressee. It contains information that is legally privileged, confidential or otherwise protected from use or disclosure. If you are not the intended recipient, you are strictly prohibited from reviewing, disclosing, copying using or disseminating any of this information or taking any action in reliance on or regarding this information. If you have received this fax in error, please notify us immediately by telephone so that we can arrange for its return to [place]. Phone: [phone], Toll-Free: [phone], Fax: [phone] Page: 1 of 1 Call Id: [id number] [place] Patient Name: Rita Vasquez [facility] [place] Gender: Female DOB: [birth year] Age: 81 Y 24 D Return Phone Number: Address: City/State/Zip: [place] Client [place] Night - Client Client Site Corder Physician Viviana Simpler Contact Type Call Call Type Page Only Caller Name Angela Nevin Relationship To Patient Provider Is this call to report lab results? No Return Phone Number Unavailable Initial Comment Caller states Angela Nevin w/ [facility] [phone] pt has low grade temp and going to bathroom frequently. would like order for [facility] Nurse Assessment Guidelines Guideline Title Affirmed Question Affirmed Notes Nurse Date/Time ([place] Time) Disp. Time Eilene Ghazi Time) Disposition Final User 06/17/2014 2:29:29 PM Send to [place], [place] Cerise 06/17/2014 2:45:13 PM Called On-Call Provider Anoushka, Divito 06/17/2014 2:46:01 PM Page Completed Yes Byrd Hesselbach After Care Instructions Given Call Event Type User Date / Time Description Paging DoctorName DoctorPhone DateTime Result/Outcome Notes Crissie Sickles  [phone] 06/17/2014 2:45:13 PM Called On Call Provider - Leonette Nutting 06/17/2014 2:45:40 PM Spoke with On Call - General

## 2014-06-28 ENCOUNTER — Encounter: Payer: Self-pay | Admitting: Internal Medicine

## 2014-07-01 ENCOUNTER — Ambulatory Visit (INDEPENDENT_AMBULATORY_CARE_PROVIDER_SITE_OTHER): Payer: Medicare (Managed Care) | Admitting: Podiatry

## 2014-07-01 DIAGNOSIS — M79674 Pain in right toe(s): Secondary | ICD-10-CM

## 2014-07-01 DIAGNOSIS — B351 Tinea unguium: Secondary | ICD-10-CM

## 2014-07-01 NOTE — Progress Notes (Signed)
Subjective:     Patient ID: Rita Vasquez, female   DOB: December 31, 1915, 81 y.o.   MRN: [id number]  HPI patient presents with thick nails 1-5 of both feet   Review of Systems     Objective:   Physical Exam Neurovascular status intact with thick nailbeds 1-5 both feet    Assessment:     Mycotic nail infection is with pain 1-5 both feet    Plan:     Debride painful nailbeds 1-5 of both feet

## 2014-07-07 ENCOUNTER — Encounter: Payer: Self-pay | Admitting: Internal Medicine

## 2014-07-07 ENCOUNTER — Non-Acute Institutional Stay: Payer: Medicare Other | Admitting: Internal Medicine

## 2014-07-07 VITALS — BP 136/78 | HR 72 | Temp 98.0°F | Resp 16 | Wt 127.0 lb

## 2014-07-07 DIAGNOSIS — Z8572 Personal history of non-Hodgkin lymphomas: Secondary | ICD-10-CM

## 2014-07-07 DIAGNOSIS — K111 Hypertrophy of salivary gland: Secondary | ICD-10-CM | POA: Diagnosis not present

## 2014-07-07 DIAGNOSIS — I5022 Chronic systolic (congestive) heart failure: Secondary | ICD-10-CM | POA: Diagnosis not present

## 2014-07-07 DIAGNOSIS — R51 Headache: Secondary | ICD-10-CM | POA: Diagnosis not present

## 2014-07-07 DIAGNOSIS — I480 Paroxysmal atrial fibrillation: Secondary | ICD-10-CM

## 2014-07-07 DIAGNOSIS — G8929 Other chronic pain: Secondary | ICD-10-CM

## 2014-07-07 NOTE — Assessment & Plan Note (Signed)
Better now ---barely noticeable No symptoms

## 2014-07-07 NOTE — Assessment & Plan Note (Signed)
Persists but not as severe with the gabapentin

## 2014-07-07 NOTE — Assessment & Plan Note (Signed)
No evidence of recurrence 

## 2014-07-07 NOTE — Assessment & Plan Note (Signed)
Seems to have stabilized again On hospice care Weight up slightly but no pulmonary symptoms Continue current furosemide

## 2014-07-07 NOTE — Progress Notes (Signed)
Subjective:    Patient ID: Rita Vasquez, female    DOB: 1915-08-26, 81 y.o.   MRN: [id number]  HPI Reviewed status with Leafy Ro RN  Has gone on hospice due to the CHF She appreciates their help Still able to walk down for meals with rollator Still does her own personal care  On amiodarone and off digoxin Doesn't feel palpitations No chest pain Stable DOE--if rushes Mild edema Some regular dizziness but no falls or syncope  Still has headache just about daily Still on gabapentin and takes the extra at times  Can still feel the swollen left parotid No pain No swallowing problems  Current Outpatient Prescriptions on File Prior to Visit  Medication Sig Dispense Refill  . aspirin 81 MG tablet Take 81 mg by mouth daily.      . fluticasone (FLONASE) 50 MCG/ACT nasal spray Place 2 sprays into the nose daily.    . furosemide (LASIX) 40 MG tablet Take 40 mg by mouth 2 (two) times daily.    Marland Kitchen gabapentin (NEURONTIN) 300 MG capsule Take 1,200 mg by mouth 2 (two) times daily.     . nitroGLYCERIN (NITROSTAT) 0.4 MG SL tablet Place 0.4 mg under the tongue every 5 (five) minutes as needed for chest pain.    . potassium chloride SA (K-DUR,KLOR-CON) 20 MEQ tablet Take 1 tablet (20 mEq total) by mouth daily. 30 tablet 11  . vitamin B-12 (CYANOCOBALAMIN) 1000 MCG tablet Take 1,000 mcg by mouth daily.      . Vitamin D, Ergocalciferol, (DRISDOL) 50000 UNITS CAPS Take 50,000 Units by mouth every 30 (thirty) days.       No current facility-administered medications on file prior to visit.    Allergies  Allergen Reactions  . Coreg     Couldn't tolerate    Past Medical History  Diagnosis Date  . CHF (congestive heart failure)   . Atrial fibrillation   . Non Hodgkin's lymphoma   . Arthritis   . Pulmonary embolism   . Osteoporosis   . Parotid cyst   . Oxygen deficiency     Past Surgical History  Procedure Laterality Date  . Abdominal hysterectomy    . Cataract extraction    .  Appendectomy    . Breast biopsy    . Vertebroplasty    . Mohs surgery      Family History  Problem Relation Age of Onset  . Cancer Sister   . Cancer Brother     History   Social History  . Marital Status: Married    Spouse Name: N/A    Number of Children: 3  . Years of Education: N/A   Occupational History  . 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 Smoking status: Former Research scientist (life sciences)  . Smokeless tobacco: Never Used  . Alcohol Use: No  . Drug Use: No  . Sexual Activity: Not on file   Other Topics Concern  . Not on file   Social History Narrative   Requests DNR--done 04/03/11   Review of Systems Appetite not great Weight is up a few pounds---does have some fluid Sleeps well. Stable 2 pillows but no PND Bowels have been fine    Objective:   Physical Exam  Constitutional: She appears well-developed and well-nourished. No distress.  Neck: Normal range of motion. Neck supple. No thyromegaly present.  Small nodule only where parotid was on left  Cardiovascular: Normal rate and regular rhythm.  Exam reveals no gallop.   No murmur heard. Pulmonary/Chest: Effort normal and breath sounds normal. No respiratory distress. She has no wheezes. She has no rales.  Abdominal: Soft. There is no tenderness.  Musculoskeletal:  1+ edema in ankles  Lymphadenopathy:    She has no cervical adenopathy.  Psychiatric: She has a normal mood and affect. Her behavior is normal.          Assessment & Plan:

## 2014-07-07 NOTE — Assessment & Plan Note (Signed)
Now regular on amiodarone No anticoagulants due to bleeding risk

## 2014-08-05 ENCOUNTER — Other Ambulatory Visit: Payer: 59

## 2014-08-09 DIAGNOSIS — I429 Cardiomyopathy, unspecified: Secondary | ICD-10-CM

## 2014-08-09 DIAGNOSIS — I480 Paroxysmal atrial fibrillation: Secondary | ICD-10-CM

## 2014-08-09 DIAGNOSIS — I479 Paroxysmal tachycardia, unspecified: Secondary | ICD-10-CM | POA: Diagnosis not present

## 2014-08-09 DIAGNOSIS — I5023 Acute on chronic systolic (congestive) heart failure: Secondary | ICD-10-CM

## 2014-09-22 ENCOUNTER — Encounter: Payer: Self-pay | Admitting: Internal Medicine

## 2014-09-22 ENCOUNTER — Non-Acute Institutional Stay: Payer: Medicare Other | Admitting: Internal Medicine

## 2014-09-22 VITALS — BP 110/60 | HR 74 | Temp 97.8°F | Resp 18 | Wt 132.0 lb

## 2014-09-22 DIAGNOSIS — R51 Headache: Secondary | ICD-10-CM

## 2014-09-22 DIAGNOSIS — I480 Paroxysmal atrial fibrillation: Secondary | ICD-10-CM | POA: Diagnosis not present

## 2014-09-22 DIAGNOSIS — I5022 Chronic systolic (congestive) heart failure: Secondary | ICD-10-CM

## 2014-09-22 DIAGNOSIS — K111 Hypertrophy of salivary gland: Secondary | ICD-10-CM | POA: Diagnosis not present

## 2014-09-22 DIAGNOSIS — G8929 Other chronic pain: Secondary | ICD-10-CM

## 2014-09-22 NOTE — Assessment & Plan Note (Signed)
Gland seems down now Has some swelling but doesn't appear worrisome Observe only

## 2014-09-22 NOTE — Assessment & Plan Note (Signed)
Still regular on the amiodarone ASA only due to age and bleeding risks

## 2014-09-22 NOTE — Progress Notes (Signed)
Subjective:    Patient ID: Rita Vasquez, female    DOB: 07/01/1915, 81 y.o.   MRN: [id number]  HPI Visit for follow up of chronic medical problems Reviewed status with Leafy Ro RN  Still notes the chronic headaches Feels the gabapentin gives some relief Intermittent Has rarely needed the extra gabapentin  Breathing is about the same DOE if she walks too far or lifts things Able to walk down to dining room without dyspnea She can rest and she feels better fairly quickly No chest pain Hasn't needed the nitro recently  She still feels the swelling over the left parotid No worse No pain No swallowing problems  Worsening urge incontinence Regularly now Will find pad wet without even knowing it Tries to void frequently to keep her bladder empty  Current Outpatient Prescriptions on File Prior to Visit  Medication Sig Dispense Refill  . amiodarone (PACERONE) 100 MG tablet Take 100 mg by mouth daily.    Marland Kitchen aspirin 81 MG tablet Take 81 mg by mouth daily.      . fluticasone (FLONASE) 50 MCG/ACT nasal spray Place 2 sprays into the nose daily.    . furosemide (LASIX) 40 MG tablet Take 40 mg by mouth 2 (two) times daily.    Marland Kitchen gabapentin (NEURONTIN) 100 MG capsule Take 100 mg by mouth 2 (two) times daily as needed. For breakthrough headache    . gabapentin (NEURONTIN) 300 MG capsule Take 1,200 mg by mouth 2 (two) times daily.     Marland Kitchen loratadine (CLARITIN) 10 MG tablet Take 10 mg by mouth daily as needed for allergies.    . nitroGLYCERIN (NITROSTAT) 0.4 MG SL tablet Place 0.4 mg under the tongue every 5 (five) minutes as needed for chest pain.    . potassium chloride SA (K-DUR,KLOR-CON) 20 MEQ tablet Take 1 tablet (20 mEq total) by mouth daily. 30 tablet 11  . vitamin B-12 (CYANOCOBALAMIN) 1000 MCG tablet Take 1,000 mcg by mouth daily.      . Vitamin D, Ergocalciferol, (DRISDOL) 50000 UNITS CAPS Take 50,000 Units by mouth every 30 (thirty) days.       No current facility-administered  medications on file prior to visit.    Allergies  Allergen Reactions  . Coreg     Couldn't tolerate    Past Medical History  Diagnosis Date  . CHF (congestive heart failure)   . Atrial fibrillation   . Non Hodgkin's lymphoma   . Arthritis   . Pulmonary embolism   . Osteoporosis   . Parotid cyst   . Oxygen deficiency     Past Surgical History  Procedure Laterality Date  . Abdominal hysterectomy    . Cataract extraction    . Appendectomy    . Breast biopsy    . Vertebroplasty    . Mohs surgery      Family History  Problem Relation Age of Onset  . Cancer Sister   . Cancer Brother     History   Social History  . Marital Status: Married    Spouse Name: N/A  . Number of Children: 3  . Years of Education: N/A   Occupational History  . 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 Smoking status: Former Research scientist (life sciences)  . Smokeless tobacco: Never Used  . Alcohol Use: No  . Drug Use: No  . Sexual Activity: Not on file   Other Topics Concern  . Not on file  Social History Narrative   Requests DNR--done 04/03/11   Review of Systems  Feels her allegies are reasonably controlled Appetite is fine Weight is stable Sleeps okay on 2 pillows. No PND     Objective:   Physical Exam  Constitutional: She appears well-developed. No distress.  Neck: Normal range of motion. Neck supple. No thyromegaly present.  Fullness over left supraclavicular space--not parotid or gland. Doesn't seem to be lung hernia either (?fatty)  Cardiovascular: Normal rate, regular rhythm and normal heart sounds.  Exam reveals no gallop.   No murmur heard. Pulmonary/Chest: Effort normal and breath sounds normal. No respiratory distress. She has no wheezes. She has no rales.  Abdominal: Soft. There is no tenderness.  Musculoskeletal:  Trace edema  Lymphadenopathy:    She has no cervical adenopathy.  Skin: No rash noted.  Psychiatric: She has a normal mood and  affect. Her behavior is normal.          Assessment & Plan:

## 2014-09-22 NOTE — Assessment & Plan Note (Signed)
Stable DOE Maintains her functional status here in [place] Continues on hospice care

## 2014-09-22 NOTE — Assessment & Plan Note (Signed)
Persists intermittently but is satisfied with the pain relief on the gabapentin

## 2014-09-30 ENCOUNTER — Ambulatory Visit (INDEPENDENT_AMBULATORY_CARE_PROVIDER_SITE_OTHER): Payer: Medicare Other | Admitting: Podiatry

## 2014-09-30 DIAGNOSIS — M79676 Pain in unspecified toe(s): Secondary | ICD-10-CM

## 2014-09-30 DIAGNOSIS — B351 Tinea unguium: Secondary | ICD-10-CM

## 2014-09-30 NOTE — Progress Notes (Signed)
Subjective:     Patient ID: Rita Vasquez, female   DOB: 10-May-1916, 81 y.o.   MRN: [id number]  HPI patient presents with thick nails 1-5 of both feet   Review of Systems     Objective:   Physical Exam Neurovascular status intact with thick nailbeds 1-5 both feet    Assessment:     Mycotic nail infection is with pain 1-5 both feet    Plan:     Debride painful nailbeds 1-5 of both feet

## 2014-10-08 NOTE — Discharge Summary (Signed)
PATIENT NAME:  Rita Vasquez, Rita Vasquez MR#:  [id number] DATE OF BIRTH:  1915-08-21  DATE OF ADMISSION:  03/22/2014 DATE OF DISCHARGE:    DISCHARGE DIAGNOSES: 1.  Atrial fibrillation with rapid ventricular rate. Rate is better controlled in normal sinus rhythm, now on amiodarone.  2.  Acute on chronic systolic heart failure.  3.  Severe cardiomyopathy with ejection fraction of 15% in 2010.   SECONDARY DIAGNOSES: 1.  Hypertension.  2.  Hyperlipidemia.  3.  History of pulmonary embolus.  4.  Osteoporosis.  5.  Orthostatic hypotension.  6.  Atrial fibrillation.   CONSULTATIONS:  1.  Cardiology, Dr. Serafina Royals.  2.  Physical therapy.   PROCEDURES AND RADIOLOGY: A chest x-ray on August 6 showed no acute cardiopulmonary disease.   LABORATORY PANEL: UA on admission was negative.   HISTORY AND SHORT HOSPITAL COURSE: The patient is a 81 year old female with the above-mentioned medical problems who was admitted for weakness, thought to be multifactorial in nature, along with atrial fibrillation with rapid ventricular rate, for which cardiology consultation was obtained with Dr. Serafina Royals who recommended amiodarone drip, which was started. The patient was converted back to normal sinus rhythm. The patient reportedly had systolic heart failure which was on past echocardiogram in 2010 or 2011 showing EF of 15% to 20%. The patient was switched over to p.o. amiodarone, but considering her age and overall poor status along with multiple comorbid conditions, palliative care consultation was obtained, who discussed with family along with patient and recommended hospice evaluation while at [facility] if she does not get any better. Physical therapy evaluation was obtained and recommended short-term rehabilitation for which the patient is being transferred to [place] in their healthcare complex for short-term rehabilitation. Her heart rate was under better control back in normal sinus rhythm. She is being  discharged back to [facility] rehabilitation back in stable condition.    PERTINENT PHYSICAL EXAMINATION ON THE DATE OF DISCHARGE: VITAL SIGNS: On the date of discharge her vital signs are as follows: Temperature 97.9, heart rate 88 per minute, respirations 16 per minute, blood pressure 124/75. She was saturating 91% on room air.  CARDIOVASCULAR: S1, S2 normal. No murmur, rales or gallop.  LUNGS: Clear to auscultation bilaterally. No wheezing, rales, rhonchi, or crepitation.  ABDOMEN: Soft, benign.  NEUROLOGIC: Nonfocal examination. All other physical examination remained at baseline.   DISCHARGE MEDICATIONS: 1.  Aspirin 81 mg p.o. daily.  2.  Calcium with vitamin D 1 tablet p.o. b.i.d.  3.  Digoxin 0.125 mg p.o. daily.  4.  Vitamin B12 at 1000 mcg p.o. daily.  5.  Vitamin D3 at 50,000 international units 1 capsule once a month on the first of each month.   6.  Tramadol 50 mg half to 1 tablet p.o. 3 times a day as needed.  7.  Lasix 40 mg p.o. daily and take an extra tablet as needed for weight greater than 120 pounds.  8.  Claritin 10 mg p.o. daily as needed.  9.  Lasix 40 mg p.o. every morning.  10.   Amiodarone 400 mg p.o. daily.   DISCHARGE DIET: Regular.   DISCHARGE ACTIVITY: As tolerated.   DISCHARGE INSTRUCTIONS AND FOLLOWUP: The patient was instructed to follow up with her primary care physician, Dr. Viviana Simpler in 1 to 2 weeks. She will need followup with [facility] cardiology, Dr. Serafina Royals in 2 to 4 weeks. She will get physical therapy evaluation and management while at the facility. If  her clinical condition worsens or she goes back into rapid atrial fibrillation, her family was instructed to consider hospice services while at the facility or get palliative care consultation while there and avoid rehospitalization if at all possible. The family was also asked to discuss this with the patient's primary care physician as patient would not want aggressive care at this  time.   CODE STATUS: DNR.   TOTAL TIME TAKING CARE OF THIS PATIENT: 55 minutes.    ____________________________ Lucina Mellow. Manuella Ghazi, MD vss:at D: 03/25/2014 13:28:10 ET T: 03/25/2014 14:16:03 ET JOB#: [id number]  cc: Sherrod Toothman S. Manuella Ghazi, MD, <Dictator> Venia Carbon, MD Efraim Kaufmann, MD Corey Skains, MD Lucina Mellow [facility] MD ELECTRONICALLY SIGNED 03/28/2014 9:49

## 2014-10-08 NOTE — Consult Note (Signed)
PATIENT NAME:  Rita Vasquez, Rita Vasquez MR#:  [id number] DATE OF BIRTH:  05-27-1916  DATE OF CONSULTATION:  03/23/2014  REFERRING PHYSICIAN:  Barnetta Chapel P. Volanda Napoleon, MD.  CONSULTING PHYSICIAN:  Corey Skains, MD  REASON FOR CONSULTATION: Nonvalvular paroxysmal atrial fibrillation with rapid ventricular rate with acute on chronic systolic dysfunction congestive heart failure, elevated troponin.   CHIEF COMPLAINT: "I feel weak and fatigued."   HISTORY OF PRESENT ILLNESS.  This is a 81 year old female with known systolic dysfunction, congestive heart failure in the past, which is chronic, now with acute on chronic systolic dysfunction congestive heart failure with elevated BNP of 1102 and some shortness of breath. The patient likely has this exacerbation by having a new-onset nonvalvular paroxysmal atrial fibrillation with rapid ventricular rate with a heart rate of 160 beats per minute. This is a very wide-complex tachycardia, atrial fibrillation in nature, and has caused demand ischemia with an elevated troponin of 0.1. She has had some valvular heart disease, as well, which had been stable on digoxin alone. The patient had peripheral edema and was being treated with Lasix. She has now been weak and fatigued over the last 24 hours with this rapid ventricular rate. With an injection of amiodarone, 150 mg, she did convert to normal sinus rhythm with significant bradycardia to 40 beats per minute and then had recurrence of tachycardia.  Now she still feels weak and fatigued with lower blood pressure. The remainder of the review of systems cannot be assessed due to the patient's inability to converse at this time.   PAST MEDICAL HISTORY:  1.  Chronic systolic dysfunction heart failure.  2.  Paroxysmal atrial fibrillation.  3.  Essential hypertension.  4.  Valvular heart disease.   FAMILY HISTORY: No family members with early onset of cardiovascular disease or hypertension.   SOCIAL HISTORY: She currently denies  alcohol or tobacco use.   ALLERGIES TO MEDICATIONS:  AS LISTED.   PHYSICAL EXAMINATION:  VITAL SIGNS: Blood pressure is 100/60 bilaterally. Heart rate is 120 upright, reclining, and regular.  GENERAL: The patient is an ill-appearing elderly female in mild acute distress.  HEENT: No icterus, thyromegaly, ulcers, hemorrhage, or xanthelasma.  CARDIOVASCULAR: Rapid rate with normal S1, S2 and a 2-3 out of 6 apical murmur consistent with mitral regurgitation. PMI is diffuse. Carotid upstroke normal with no apparent significant jugular venous pressure.  LUNGS: Have bibasilar crackles with normal respirations.  ABDOMEN: Soft, nontender, without hepatosplenomegaly or masses. Abdominal aorta is normal size without bruit.  EXTREMITIES: 2+ radial, femoral, dorsal pedal pulses, with no lower extremity edema, cyanosis, clubbing or ulcers.  NEUROLOGIC: She is oriented to time, place, and person, with normal mood and affect.   ASSESSMENT: A 81 year old female with acute on chronic systolic dysfunction congestive heart failure with elevated BNP, elevated troponin consistent with demand ischemia rather than acute coronary syndrome, and episode of atrial fibrillation with rapid ventricular rate needing further treatment options.   RECOMMENDATIONS:  1.  Reinstate amiodarone drip for treatment of tachycardia and then change to oral medication management as able, if converted to normal sinus rhythm, watching closely for episode of bradycardia.  2.  Discontinuation of digoxin due to concerns of interactions of medication.  3.  Lasix as needed for peripheral edema, pulmonary edema, for congestive heart failure.  4.  No further intervention minimal elevated troponin, most consistent with demand ischemia.  5.  Ambulation and further treatment thereof as necessary after above.    ____________________________ Corey Skains, MD bjk:lt D:  03/23/2014 07:58:26 ET T: 03/23/2014 09:46:22 ET JOB#: [id number]  cc: Corey Skains, MD, <Dictator> Corey Skains MD ELECTRONICALLY SIGNED 03/23/2014 18:03

## 2014-10-08 NOTE — H&P (Signed)
PATIENT NAME:  Rita Vasquez, PRESUTTI MR#:  [id number] DATE OF BIRTH:  1915/11/01  DATE OF ADMISSION:  03/22/2014  PRIMARY CARE PHYSICIAN:  Viviana Simpler, MD  REFERRING EMERGENCY ROOM PHYSICIAN: Ferman Hamming, MD  CHIEF COMPLAINT: Weakness.   HISTORY OF PRESENT ILLNESS: This very pleasant 81 year old woman with past medical history of systolic heart failure (no ejection fraction noted in this chart), atrial fibrillation, hypertension, hyperlipidemia, presents today from [facility] with complaint of weakness. She reports that upon waking she has been in her normal state of health yesterday, upon waking this morning, she felt profoundly weak and was unable to get out of bed. Her daughter came to visit her this morning and brought her by car to the Emergency Room. On presentation to the Emergency Room, she is found to be in a wide complex tachycardia with a rate of 170. Emergency Room physician, Dr. Benjaman Lobe, discussed with her primary cardiologist, Dr. Nehemiah Massed and gave her 150 mg infusion dose of amiodarone. At that time, her heart rate normalized to normal sinus rhythm and even became bradycardic into the 40s. Hospitalist service is asked to admit for further evaluation and treatment. At the time of admission she is stable with heart rate in the 60s.   PAST MEDICAL HISTORY:  1. Systolic heart failure, no echocardiogram in this chart.  2. Atrial fibrillation, on aspirin.  3. Hypertension.  4. Hyperlipidemia.  5. History of skin cancer over the forehead and nose.  6. History of pulmonary embolism.  7. History of osteoporosis.  8. Orthostatic hypotension.  9. Arthropathy.   PAST SURGICAL HISTORY:  1. Appendectomy.  2. Hysterectomy.  3. Bilateral lumpectomies.  4. Varicose vein procedure.  5. Cataract surgery.  6. Tonsillectomy.  7. Mohs procedure x 5 with skin graft over the forehead.   ALLERGIES: No known allergies.   HOME MEDICATIONS:  1. Vitamin D3 50,000  international units 1 capsule once a month.  2. Vitamin B12 1000 mcg 1 tablet once a day.  3. Tramadol 50 mg, 0.5 to 1 tablet orally 3 times a day as needed for pain.  4. Oxygen 2 liters via nasal cannula as needed.  5. Furosemide 40 mg 1 tablet once a day at 1:30 p.m. as needed if weight is greater than 120 pounds.  6. Furosemide 40 mg 1 tablet once a day in the morning.  7. Digoxin 125 mcg 1 tablet once a day in the morning.  8. Claritin 10 mg 1 tablet daily as needed.  9. Calcium 600 mg vitamin D 1 tablet 2 times a day.  10. Aspirin 81 mg orally once a day.   SOCIAL HISTORY: The patient is currently a resident at [facility]. Her daughter,  Maribell Demeo, who lives in [place], is her power of attorney. She does not smoke cigarettes. She drinks 5 ounces of alcohol with dinner. She has 3 daughters. She does not generally use a cane or walker for ambulation. She is on oxygen 2 liters chronically for the past 3 years. The patient is a DO NOT RESUSCITATE.   FAMILY HISTORY: Positive for stroke in her mother. Her father died of bladder cancer. She denies any family history of coronary artery disease.  REVIEW OF SYSTEMS:   GENERAL: No fevers, chills, weight change, positive for fatigue today as noted above.  HEENT: Negative for change in hearing or vision. No pain in eyes or ears. No difficulty swallowing. No pain in the mouth, no sore throat.  RESPIRATORY: Positive for shortness of breath briefly this morning, otherwise no shortness of breath, orthopnea, cough, wheezing.  CARDIOVASCULAR: Positive for palpitations this morning, negative for chest pain, edema, syncope, positive for varicose veins.  GASTROINTESTINAL: Negative for nausea, vomiting, diarrhea, abdominal pain.  GENITOURINARY: Negative for frequency or dysuria.  MUSCULOSKELETAL: Negative for any tender or swollen joints. No limited activity due to joint pain or muscle pain.  NEUROLOGIC: No focal numbness or  weakness. No headache or seizure. She does report that she was presyncopal this morning.  PSYCHIATRIC: No recent anxiety or depression.   PHYSICAL EXAMINATION: At the time of examination:  VITAL SIGNS: Temperature 97.9, pulse 66, respirations 28, blood pressure 100/59, oxygenation 99% on 2 liters nasal cannula.  GENERAL: No acute distress.  HEENT: Pupils are equal, round, and reactive to light. Extraocular motion is intact. Conjunctivae are clear. Oral mucous membranes are pink and moist. Good dentition.  OROPHARYNX: Clear with no exudate or edema. Trachea is midline. No cervical lymphadenopathy.  RESPIRATORY: Lungs are clear to auscultation bilaterally with good air movement.  CARDIOVASCULAR: Distant heart sounds, regular rate and rhythm, no murmurs, rubs, or gallops, no peripheral edema. Peripheral pulses are 1+.  ABDOMEN: Soft, nontender, nondistended. Bowel sounds are normal.  MUSCULOSKELETAL: Range of motion is normal. No warm or tender joints, strength is 4/4 throughout.  NEUROLOGIC: Cranial nerves II through XII are grossly intact, nonfocal neurologic examination.  PSYCHIATRIC: The patient is alert and oriented x 4. Has good insight, calm. No signs of uncontrolled anxiety or depression.   LABORATORY DATA: Sodium 135, potassium 3.9, chloride 101, bicarbonate 27, BUN 18, creatinine 0.94, glucose is 110. BNP is 1102. LFTs are normal. Initial troponin is negative at less than 0.02. TSH is 2.03. Digoxin level is less than 0.1. White blood cells 6.9, hemoglobin 13.1, platelets 140,000  MCV is 90. INR is 1.0.   IMAGING: Chest x-ray shows no acute cardiopulmonary disease. There is a mild right hemidiaphragm elevation with likely secondary infrahilar volume loss.   ASSESSMENT AND PLAN:  1. Wide complex tachycardia, fairly regular, atrial fibrillation versus flutter:  She has received 150 mg of amiodarone and dig transiently into the 40s. She is currently in normal sinus rhythm in the 60s. We will  continue to cycle cardiac enzymes to rule out an acute event this morning, but ACS is low on the differential. We will continue to monitor on telemetry. She does have chronic orthostatic hypotension and I would be reluctant to start a beta blocker on this patient. We will consult Dr. Nehemiah Massed who will see her tomorrow. If heart rate trends upwards overnight would consider re-bolusing with amiodarone 150 mg rather than starting p.o. medication at this time. Could start drip at lowest dose.  2. Systolic heart failure: 2D echocardiogram is not in this chart. Her EF is unknown at this time. She has no signs of failure though BNP remains elevated. We will continue Lasix.  3. Chronic respiratory failure with hypoxia: We will continue continuous oxygen as she is on it at home. Oxygen saturation is good at this time, no respiratory distress. Chest x-ray is clear. She is a never smoker.  4. Atrial fibrillation: Currently in normal sinus rhythm. Rate is controlled at this time. She is on aspirin only, not on anticoagulation.   TIME SPENT ON ADMISSION: 45 minutes.    ____________________________ Earleen Newport. Volanda Napoleon, MD cpw:JT D: 03/22/2014 21:04:23 ET T: 03/22/2014 21:41:15 ET JOB#: [id number]  cc: Barnetta Chapel P. Volanda Napoleon, MD, <Dictator> Aldean Jewett MD  ELECTRONICALLY SIGNED 03/23/2014 13:37

## 2014-10-18 ENCOUNTER — Encounter: Payer: Self-pay | Admitting: Podiatry

## 2014-10-18 ENCOUNTER — Ambulatory Visit (INDEPENDENT_AMBULATORY_CARE_PROVIDER_SITE_OTHER): Payer: Medicare (Managed Care) | Admitting: Podiatry

## 2014-10-18 VITALS — Ht 63.5 in | Wt 121.0 lb

## 2014-10-18 DIAGNOSIS — M79674 Pain in right toe(s): Secondary | ICD-10-CM

## 2014-10-18 DIAGNOSIS — L600 Ingrowing nail: Secondary | ICD-10-CM

## 2014-10-19 NOTE — Progress Notes (Signed)
Patient ID: Rita Vasquez, female   DOB: 1916/01/01, 81 y.o.   MRN: [id number]  Subjective: 81 year old female returns the office today for complaints of right big toe nail pain. She state the nail is ingrowing into the skin. She periodically gets the nail debrided which seems to help.  She denies any recent redness or drainage from around the nail sites. The nail is painful with shoe gear and pressure. Denies any systemic complaints as fevers, chills, nausea, vomiting. No other complaints at this time in no acute changes since last appointment.  Objective: AAO 3, NAD DP/PT pulses palpable bilaterally, CRT less than 3 seconds Protective sensation appears to be intact with Derrel Nip monofilament, vibratory sensation intact. Right hallux nail hypertrophic, dystrophic, elongated, brittle, discolored with incurvation of both the medial and lateral nail borders distally. Most of the pain is along the lateral nail border. There is no pain along the proximal nail borders. There is no swelling erythema or drainage from the nail. No clinical signs of infection. Remaining nails are also hypertrophic, dystrophic, elongated, discolored, brittle which are also subjectively painfull. No swelling erythema or drainage from the remaining nail sites. No open lesions or pre-ulcerative lesions. No pain with calf compression, swelling, warmth, erythema.  Assessment: 81 year old female with symptomatic onychomycosis, ingrown toenail right lateral hallux  Plan: -Treatment options were discussed the patient including alternatives, risks, complications. -Right hallux toenail sharply debrided to patient comfort. The ingrown portion of the toenail was removed. Upon debridement of the nail there is resolution of symptoms. Continue to monitor the area for any clinical signs or symptoms of infection or any reoccurrence. Discussed partial nail avulsion but she wishes to hold off on that procedure.  -Remaining nails  were also starting debrided without complications. -Discussed the importance of daily foot inspection. -Follow-up in 3 months or sooner should any problems arise. In the meantime, call the office with any questions, concerns, change in symptoms.

## 2014-11-03 ENCOUNTER — Encounter: Payer: Self-pay | Admitting: Internal Medicine

## 2014-11-07 ENCOUNTER — Encounter: Payer: Self-pay | Admitting: Internal Medicine

## 2014-11-09 ENCOUNTER — Non-Acute Institutional Stay: Payer: Medicare Other | Admitting: Internal Medicine

## 2014-11-09 ENCOUNTER — Encounter: Payer: Self-pay | Admitting: Internal Medicine

## 2014-11-09 VITALS — BP 80/60 | HR 92 | Temp 98.0°F | Resp 18 | Wt 132.0 lb

## 2014-11-09 DIAGNOSIS — I9589 Other hypotension: Secondary | ICD-10-CM

## 2014-11-09 DIAGNOSIS — R404 Transient alteration of awareness: Secondary | ICD-10-CM

## 2014-11-09 DIAGNOSIS — R3915 Urgency of urination: Secondary | ICD-10-CM | POA: Diagnosis not present

## 2014-11-09 NOTE — Progress Notes (Signed)
Subjective:    Patient ID: Rita Vasquez, female    DOB: 05/26/1916, 81 y.o.   MRN: [id number]  HPI  Asked to evaluate resident in [place] she had altered mental status. The night before, she slept in her daytime clothes. She did feel a little lightheaded during the day. She feels much better today. She denies headache, dizziness, lightheadedness, chest pain or worsening shortness of breath. She does not know why she felt so bad yesterday. She does report increase in urgency. She did have a UTI at the beginning of May which did seem to resolve with antibiotics RN just wanted her to be evaluated to make sure the kidney infection was gone. Of note, her BP today is 80/60, but she reports that she feels just fine.  Review of Systems      Past Medical History  Diagnosis Date  . CHF (congestive heart failure)   . Atrial fibrillation   . Non Hodgkin's lymphoma   . Arthritis   . Pulmonary embolism   . Osteoporosis   . Parotid cyst   . Oxygen deficiency     Current Outpatient Prescriptions  Medication Sig Dispense Refill  . amiodarone (PACERONE) 100 MG tablet Take 100 mg by mouth daily.    Marland Kitchen aspirin 81 MG tablet Take 81 mg by mouth daily.      . Calcium Carb-Cholecalciferol 600-200 MG-UNIT TABS Take 1 tablet by mouth 2 (two) times daily.    . fluticasone (FLONASE) 50 MCG/ACT nasal spray Place 2 sprays into the nose daily.    . furosemide (LASIX) 40 MG tablet Take 40 mg by mouth 2 (two) times daily.    Marland Kitchen gabapentin (NEURONTIN) 100 MG capsule Take 100 mg by mouth 2 (two) times daily as needed. For breakthrough headache    . gabapentin (NEURONTIN) 300 MG capsule Take 1,200 mg by mouth 2 (two) times daily.     Marland Kitchen loratadine (CLARITIN) 10 MG tablet Take 10 mg by mouth daily as needed for allergies.    . nitroGLYCERIN (NITROSTAT) 0.4 MG SL tablet Place 0.4 mg under the tongue every 5 (five) minutes as needed for chest pain.    . potassium chloride SA (K-DUR,KLOR-CON) 20  MEQ tablet Take 1 tablet (20 mEq total) by mouth daily. 30 tablet 11  . vitamin B-12 (CYANOCOBALAMIN) 1000 MCG tablet Take 1,000 mcg by mouth daily.      . Vitamin D, Ergocalciferol, (DRISDOL) 50000 UNITS CAPS Take 50,000 Units by mouth every 30 (thirty) days.       No current facility-administered medications for this visit.    Allergies  Allergen Reactions  . Carvedilol Nausea And Vomiting    Couldn't tolerate  . Coreg     Couldn't tolerate    Family History  Problem Relation Age of Onset  . Cancer Sister   . Cancer Brother     History   Social History  . Marital Status: Married    Spouse Name: N/A  . Number of Children: 3  . Years of Education: N/A   Occupational History  . 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 Smoking status: Former Research scientist (life sciences)  . Smokeless tobacco: Never Used  . Alcohol Use: No  . Drug Use: No  . Sexual Activity: Not on file   Other Topics Concern  . Not on file   Social History Narrative   Requests DNR--done 04/03/11     Constitutional: Denies fever,  malaise, fatigue, headache or abrupt weight changes.  Respiratory: Pt reports mild SOB. Denies difficulty breathing, cough or sputum production.   Cardiovascular: Pt reports swelling in her feet. Denies chest pain, chest tightness, palpitations or swelling in the hands.  Gastrointestinal:  Denies abdominal pain, bloating, constipation, diarrhea or blood in the stool.  GU: Pt reports urgency. Denies frequency, pain with urination, burning sensation, blood in urine, odor or discharge. Neurological: Denies dizziness, difficulty with memory, difficulty with speech or problems with balance and coordination.   No other specific complaints in a complete review of systems (except as listed in HPI above).  Objective:   Physical Exam  BP 80/60 mmHg  Pulse 92  Temp(Src) 98 F (36.7 C)  Resp 18  Wt 132 lb (59.875 kg) Wt Readings from Last 3 Encounters:  11/09/14  132 lb (59.875 kg)  10/18/14 121 lb (54.885 kg)  09/22/14 132 lb (59.875 kg)    General: Appears her stated age, chronically ill appearing in NAD. Neck: No adenopathy noted. Cardiovascular: Normal rate and rhythm. S1,S2 noted.  Murmur noted. Pulmonary/Chest: Normal effort and positive vesicular breath sounds. No respiratory distress. No wheezes, rales or ronchi noted.  Abdomen: Soft and nontender. Normal bowel sounds, no bruits noted.  Neurological: Alert and oriented.   BMET    Component Value Date/Time   NA 140 03/23/2014 0059   NA 134* 01/29/2012 1126   K 3.5 03/23/2014 0059   K 4.5 01/29/2012 1126   CL 107 03/23/2014 0059   CL 93* 01/29/2012 1126   CO2 29 03/23/2014 0059   CO2 31 01/29/2012 1126   GLUCOSE 102* 03/23/2014 0059   GLUCOSE 95 01/29/2012 1126   BUN 17 03/23/2014 0059   BUN 18 01/29/2012 1126   CREATININE 0.86 03/23/2014 0059   CREATININE 0.7 01/29/2012 1126   CALCIUM 7.6* 03/23/2014 0059   CALCIUM 10.3 01/29/2012 1126   GFRNONAA 39* 02/05/2014 1031   GFRNONAA 66.49 05/16/2010 1336   GFRAA 45* 02/05/2014 1031    Lipid Panel  No results found for: CHOL, TRIG, HDL, CHOLHDL, VLDL, LDLCALC  CBC    Component Value Date/Time   WBC 5.7 03/23/2014 0059   WBC 7.9 01/29/2012 1126   RBC 3.94 03/23/2014 0059   RBC 4.42 01/29/2012 1126   HGB 11.3* 03/23/2014 0059   HGB 14.5 01/29/2012 1126   HCT 35.1 03/23/2014 0059   HCT 43.9 01/29/2012 1126   PLT 98* 03/23/2014 0059   PLT 161.0 01/29/2012 1126   MCV 89 03/23/2014 0059   MCV 99.3 01/29/2012 1126   MCH 28.7 03/23/2014 0059   MCHC 32.2 03/23/2014 0059   MCHC 33.0 01/29/2012 1126   RDW 14.2 03/23/2014 0059   RDW 13.4 01/29/2012 1126   LYMPHSABS 2.9 03/23/2014 0059   LYMPHSABS 1.6 01/29/2012 1126   MONOABS 0.5 03/23/2014 0059   MONOABS 0.6 01/29/2012 1126   EOSABS 0.1 03/23/2014 0059   EOSABS 0.1 01/29/2012 1126   BASOSABS 0.0 03/23/2014 0059   BASOSABS 0.0 01/29/2012 1126    Hgb A1C No results  found for: HGBA1C       Assessment & Plan:   AMS:  Transient Seems to have resolved Will continue to monitor  Urgency:  Will hold off on repeating urinalysis at this time Will continue to monitor  Hypotension:  Will hold evening dose of Lasix if BP remains below 90/50.  Will reevaluate as needed

## 2014-11-10 DIAGNOSIS — I480 Paroxysmal atrial fibrillation: Secondary | ICD-10-CM | POA: Diagnosis not present

## 2014-11-10 DIAGNOSIS — I479 Paroxysmal tachycardia, unspecified: Secondary | ICD-10-CM | POA: Diagnosis not present

## 2014-11-10 DIAGNOSIS — I5023 Acute on chronic systolic (congestive) heart failure: Secondary | ICD-10-CM | POA: Diagnosis not present

## 2014-11-10 DIAGNOSIS — I429 Cardiomyopathy, unspecified: Secondary | ICD-10-CM

## 2014-12-06 IMAGING — CT CT HEAD WITHOUT CONTRAST
1 series · 16 of 30 positions shown, 20 images · non-contrast
Comparison: Neck CT 04/15/2012.  Head CT 10/05/2009.

CLINICAL DATA: [AGE] with sudden onset of shaking with
dizziness. Headache.

EXAM:
CT HEAD WITHOUT CONTRAST
TECHNIQUE: Contiguous axial images were obtained from the base of the skull
through the vertex without intravenous contrast.

[Series 2: head wo · axial · 0.39mm/px · z∈[-103,+23]mm · 16 of 32 slices shown, 20 images]
[im 2/32  brain]
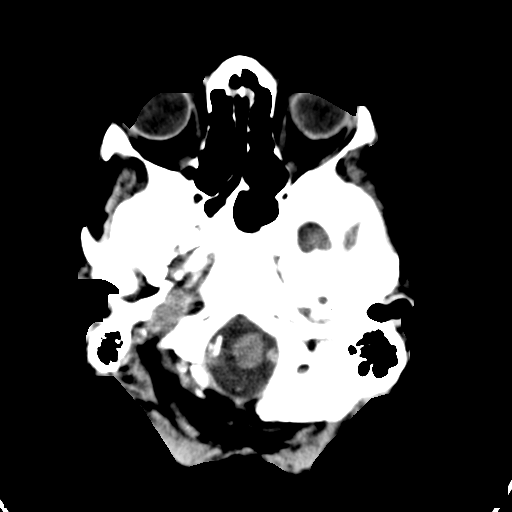
[im 2/32  bone]
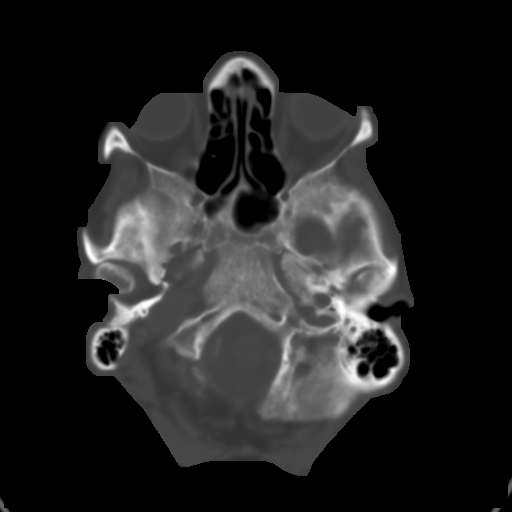
[im 4/32  brain]
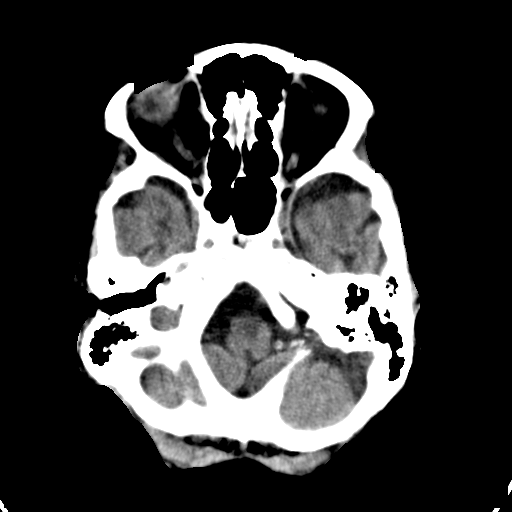
[im 6/32  brain]
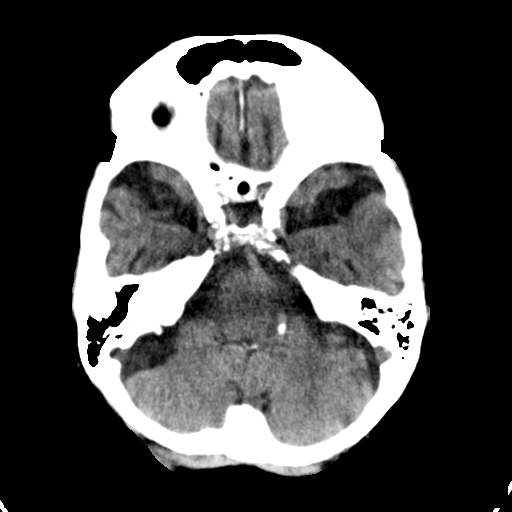
[im 8/32  brain]
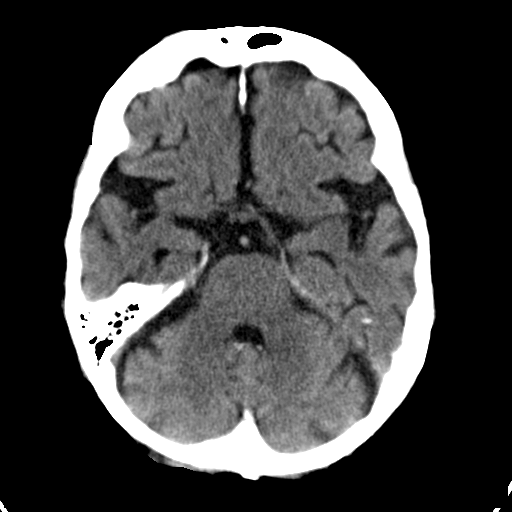
[im 9/32  brain]
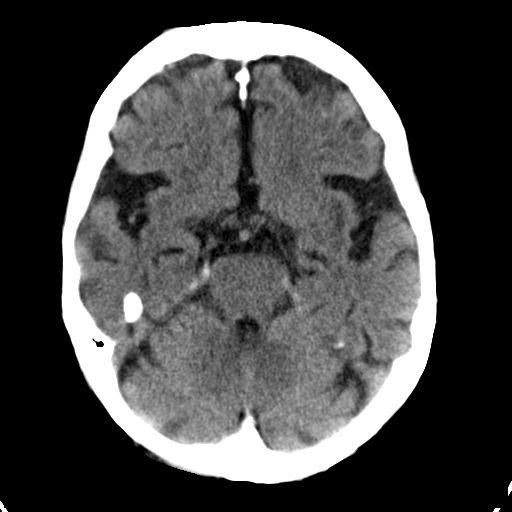
[im 9/32  bone]
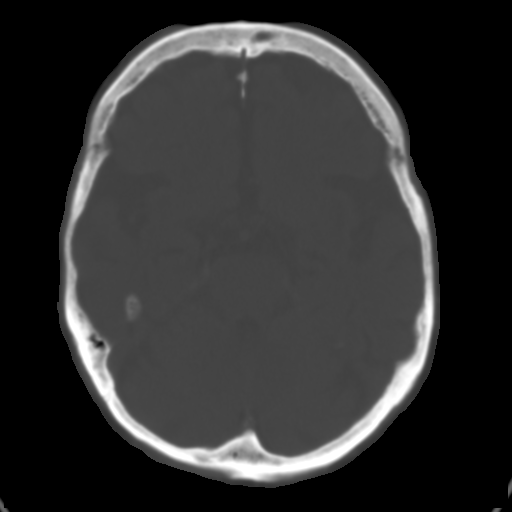
[im 11/32  brain]
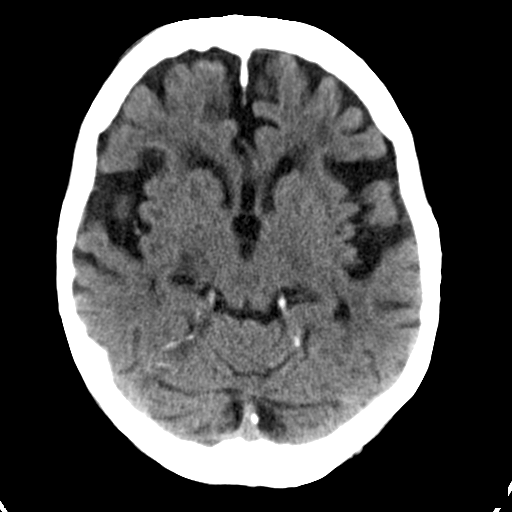
[im 13/32  brain]
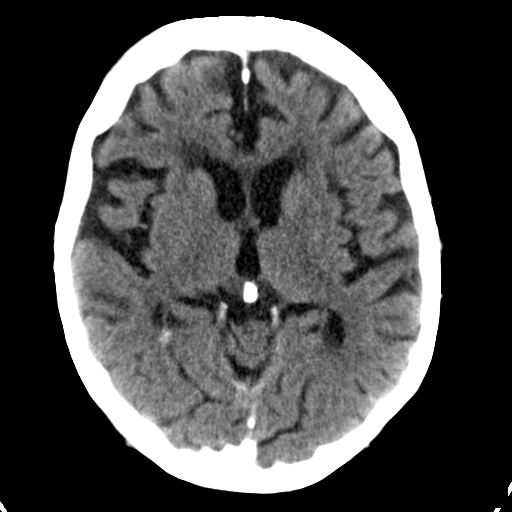
[im 15/32  brain]
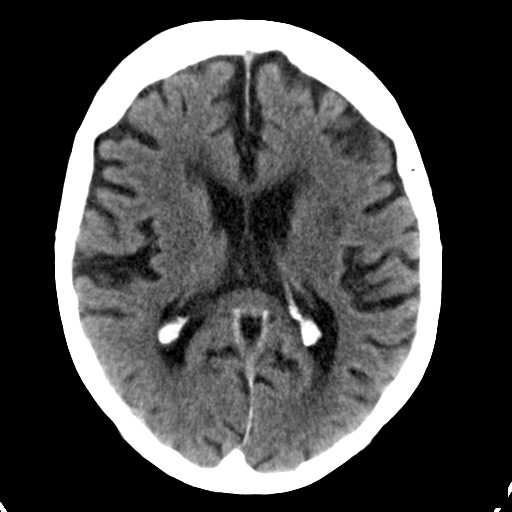
[im 17/32  brain]
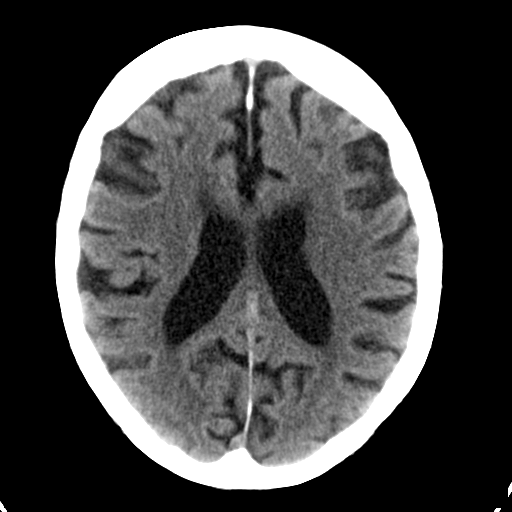
[im 17/32  bone]
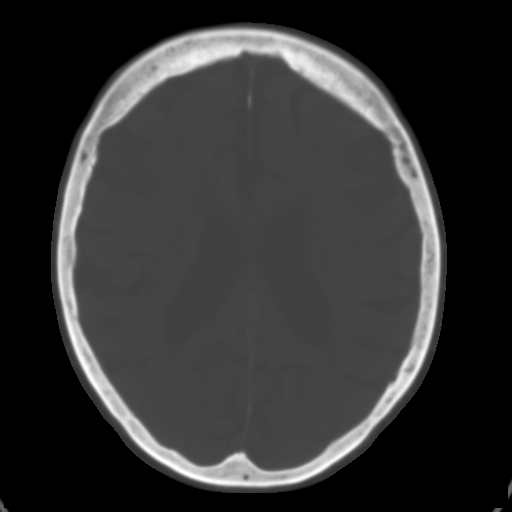
[im 19/32  brain]
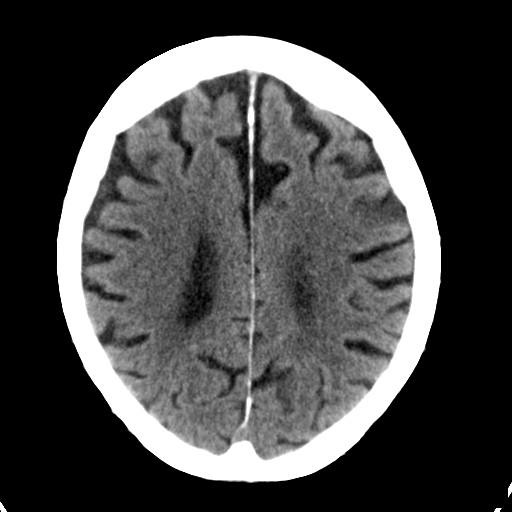
[im 21/32  brain]
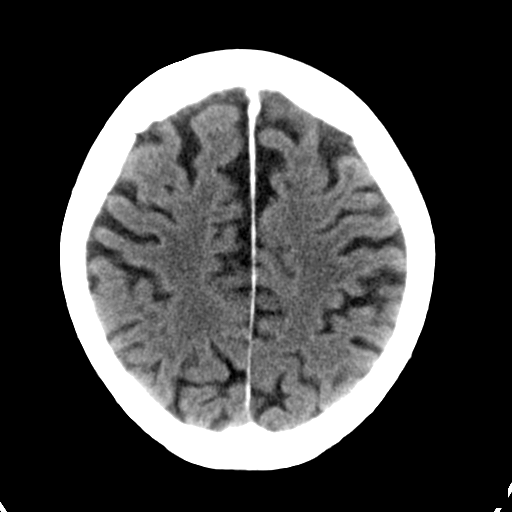
[im 23/32  brain]
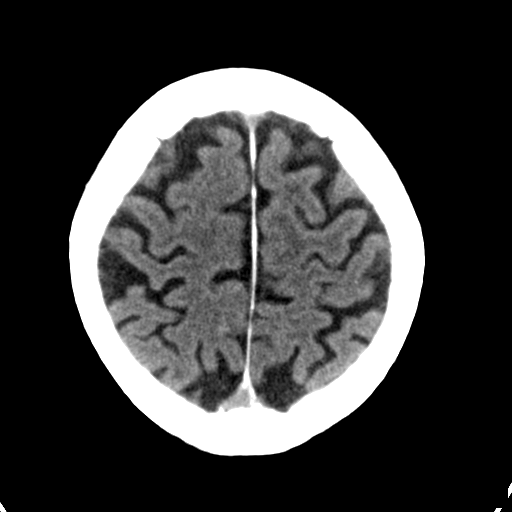
[im 24/32  brain]
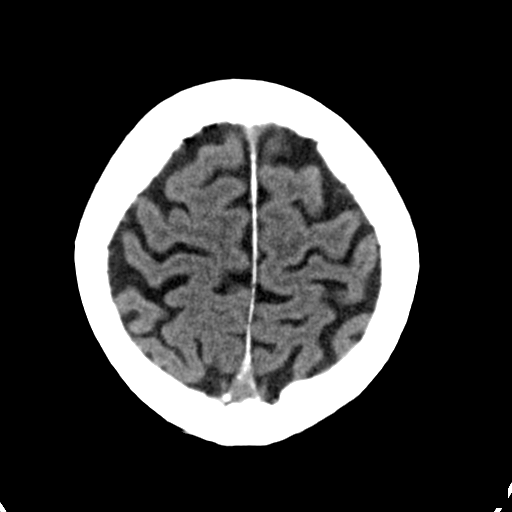
[im 24/32  bone]
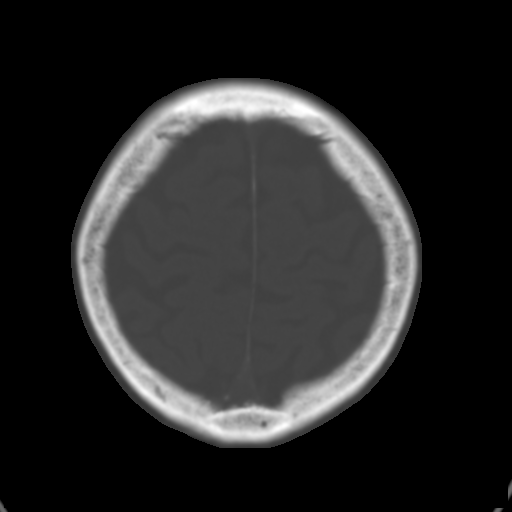
[im 26/32  brain]
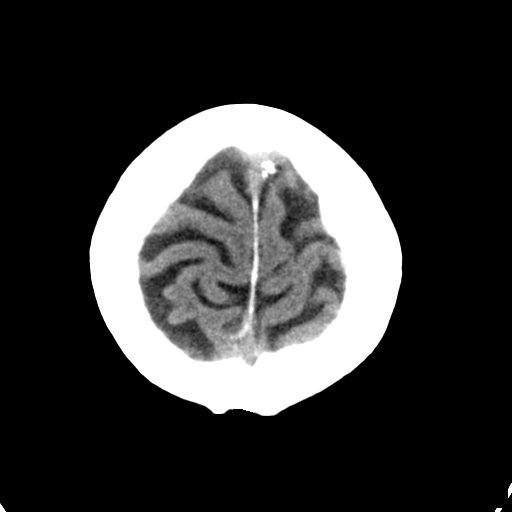
[im 28/32  brain]
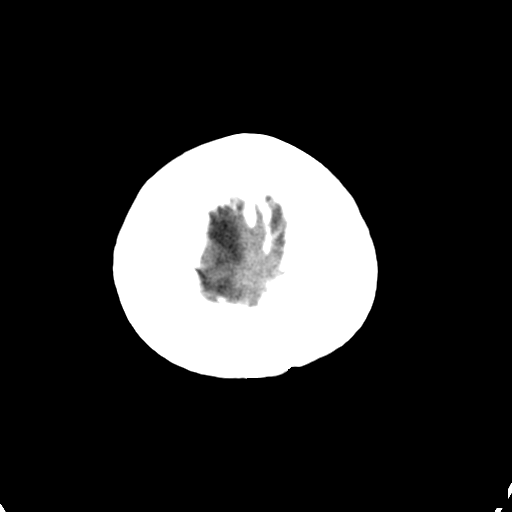
[im 30/32  brain]
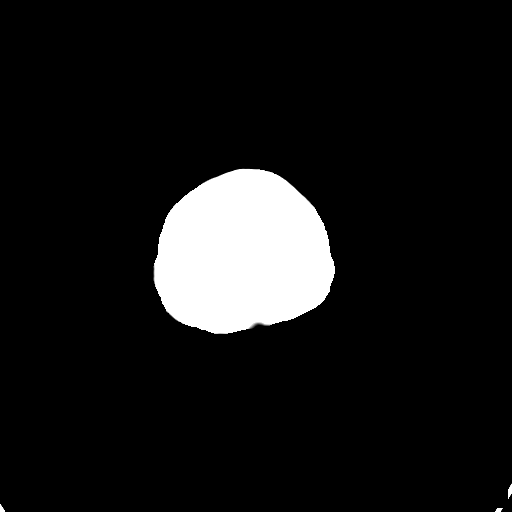

[16 of 30 positions shown; findings below may reference images not displayed]

FINDINGS: There is no evidence of acute intracranial hemorrhage, mass lesion,
brain edema or extra-axial fluid collection. The ventricles and
subarachnoid spaces are mildly prominent but stable.. There is no CT
evidence of acute cortical infarction. There is stable mild chronic
periventricular white matter disease. Intracranial vascular
calcifications are noted.

The visualized paranasal sinuses, mastoid air cells and middle ears
are clear. The calvarium is intact.
IMPRESSION: Stable examination with stable mild periventricular white matter
disease. No acute intracranial findings.

## 2014-12-08 ENCOUNTER — Ambulatory Visit (INDEPENDENT_AMBULATORY_CARE_PROVIDER_SITE_OTHER): Payer: Medicare (Managed Care) | Admitting: Podiatry

## 2014-12-08 DIAGNOSIS — B351 Tinea unguium: Secondary | ICD-10-CM

## 2014-12-08 DIAGNOSIS — M79674 Pain in right toe(s): Secondary | ICD-10-CM

## 2014-12-08 DIAGNOSIS — L600 Ingrowing nail: Secondary | ICD-10-CM

## 2014-12-08 NOTE — Progress Notes (Signed)
Patient ID: Rita Vasquez, female   DOB: May 17, 1916, 81 y.o.   MRN: [id number]  Subjective: 81 y.o.-year-old female returns the office today for painful, elongated, thickened toenails. She states the majority of her pain is in the left big toenail. She believes she is getting an ingrown toenail.  Denies any redness or drainage around the nails. Denies any acute changes since last appointment and no new complaints today. Denies any systemic complaints such as fevers, chills, nausea, vomiting.   Objective: AAO 3, NAD DP/PT pulses palpable, CRT less than 3 seconds Protective sensation intact with Simms Weinstein monofilament, Achilles tendon reflex intact.  Nails hypertrophic, dystrophic, elongated, brittle, discolored. On the left hallux nail, mostly on the lateral border more than the medial, there is evidence of incurvation of the nail distally with tenderness over the area. There is no surrounding erythema or drainage along the nail sites. No open lesions or pre-ulcerative lesions are identified. No other areas of tenderness bilateral lower extremities. No overlying edema, erythema, increased warmth. No pain with calf compression, swelling, warmth, erythema.  Assessment: Patient presents with symptomatic onychomycosis; left hallux ingrown toenail.   Plan: -Treatment options including alternatives, risks, complications were discussed -Discussed nail removal vs. slantback procedure to the left hallux nail. She does not want to proceed with nail removal. The nail was sharply debrided without complications/bleeding to patient comfort to remove the symptomatic portion of the ingrown toenail.  -Nails sharply debrided 10 without complication/bleeding. -Discussed daily foot inspection. If there are any changes, to call the office immediately.  -Follow-up as scheduled or sooner if any problems are to arise. In the meantime, encouraged to call the office with any questions, concerns, changes  symptoms.

## 2014-12-09 ENCOUNTER — Encounter: Payer: Self-pay | Admitting: Internal Medicine

## 2014-12-13 ENCOUNTER — Ambulatory Visit: Payer: Medicare Other | Admitting: Podiatry

## 2014-12-15 ENCOUNTER — Non-Acute Institutional Stay: Admitting: Internal Medicine

## 2014-12-15 ENCOUNTER — Encounter: Payer: Self-pay | Admitting: Internal Medicine

## 2014-12-15 VITALS — BP 102/60 | HR 72 | Temp 97.4°F | Resp 26 | Wt 132.0 lb

## 2014-12-15 DIAGNOSIS — R519 Headache, unspecified: Secondary | ICD-10-CM

## 2014-12-15 DIAGNOSIS — K5909 Other constipation: Secondary | ICD-10-CM | POA: Insufficient documentation

## 2014-12-15 DIAGNOSIS — I480 Paroxysmal atrial fibrillation: Secondary | ICD-10-CM | POA: Diagnosis not present

## 2014-12-15 DIAGNOSIS — R51 Headache: Secondary | ICD-10-CM

## 2014-12-15 DIAGNOSIS — K111 Hypertrophy of salivary gland: Secondary | ICD-10-CM | POA: Diagnosis not present

## 2014-12-15 DIAGNOSIS — I5022 Chronic systolic (congestive) heart failure: Secondary | ICD-10-CM | POA: Diagnosis not present

## 2014-12-15 DIAGNOSIS — K5900 Constipation, unspecified: Secondary | ICD-10-CM

## 2014-12-15 NOTE — Assessment & Plan Note (Signed)
Small firm gland still palpable--no recent change

## 2014-12-15 NOTE — Progress Notes (Signed)
Subjective:    Patient ID: Rita Vasquez, female    DOB: 12/14/1915, 81 y.o.   MRN: [id number]  HPI Visit for follow up of chronic medical conditions Reviewed status with [facility] RN  Continues on hospice Has had some slowing and more dependence Had aide for showering but dresses herself Uses the toilet but some incontinence --uses a pad Walks to dining room with rolling walker--no dyspnea for that Sleeps on 2 pillows--no PND Some edema--no major change  No palpitations Regular dizziness--if up and walking or sometimes even sitting No syncope She just knows to be careful BP often down to 90/50 --not new for her  Still with intermittent headaches On the gabapentin but doesn't take extra--just uses cool compress  Parotid still there No pain No trouble swallowing  Current Outpatient Prescriptions on File Prior to Visit  Medication Sig Dispense Refill  . amiodarone (PACERONE) 100 MG tablet Take 100 mg by mouth daily.    Marland Kitchen aspirin 81 MG tablet Take 81 mg by mouth daily.      . Calcium Carb-Cholecalciferol 600-200 MG-UNIT TABS Take 1 tablet by mouth 2 (two) times daily.    . fluticasone (FLONASE) 50 MCG/ACT nasal spray Place 2 sprays into the nose daily.    . furosemide (LASIX) 40 MG tablet Take 40 mg by mouth 2 (two) times daily.    Marland Kitchen gabapentin (NEURONTIN) 100 MG capsule Take 100 mg by mouth 2 (two) times daily as needed. For breakthrough headache    . gabapentin (NEURONTIN) 300 MG capsule Take 1,200 mg by mouth 2 (two) times daily.     Marland Kitchen loratadine (CLARITIN) 10 MG tablet Take 10 mg by mouth daily as needed for allergies.    . nitroGLYCERIN (NITROSTAT) 0.4 MG SL tablet Place 0.4 mg under the tongue every 5 (five) minutes as needed for chest pain.    . potassium chloride SA (K-DUR,KLOR-CON) 20 MEQ tablet Take 1 tablet (20 mEq total) by mouth daily. 30 tablet 11  . vitamin B-12 (CYANOCOBALAMIN) 1000 MCG tablet Take 1,000 mcg by mouth daily.      . Vitamin D,  Ergocalciferol, (DRISDOL) 50000 UNITS CAPS Take 50,000 Units by mouth every 30 (thirty) days.       No current facility-administered medications on file prior to visit.    Allergies  Allergen Reactions  . Carvedilol Nausea And Vomiting    Couldn't tolerate  . Coreg     Couldn't tolerate    Past Medical History  Diagnosis Date  . CHF (congestive heart failure)   . Atrial fibrillation   . Non Hodgkin's lymphoma   . Arthritis   . Pulmonary embolism   . Osteoporosis   . Parotid cyst   . Oxygen deficiency     Past Surgical History  Procedure Laterality Date  . Abdominal hysterectomy    . Cataract extraction    . Appendectomy    . Breast biopsy    . Vertebroplasty    . Mohs surgery      Family History  Problem Relation Age of Onset  . Cancer Sister   . Cancer Brother     History   Social History  . Marital Status: Married    Spouse Name: N/A  . Number of Children: 3  . Years of Education: N/A   Occupational History  . 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 Smoking status: Former Research scientist (life sciences)  . Smokeless tobacco: Never Used  .  Alcohol Use: No  . Drug Use: No  . Sexual Activity: Not on file   Other Topics Concern  . Not on file   Social History Narrative   Requests DNR--done 04/03/11   Review of Systems Occasional down times but not daily and no anhedonia Appetite is okay Constipation at times---uses prn once in a while No skin lesions or ulcers    Objective:   Physical Exam  Constitutional: She appears well-developed. No distress.  Neck: Normal range of motion. Neck supple.  Cardiovascular: Normal rate, regular rhythm and normal heart sounds.  Exam reveals no gallop.   No murmur heard. Pulmonary/Chest: Effort normal. No respiratory distress. She has no wheezes. She has no rales.  Decreased breath sounds at bases (?slight increased dullness)  Abdominal: Soft. There is no tenderness.  Musculoskeletal:  1+ edema in  ankles  Lymphadenopathy:    She has no cervical adenopathy.  Skin: No rash noted.  Psychiatric: She has a normal mood and affect. Her behavior is normal.          Assessment & Plan:

## 2014-12-15 NOTE — Assessment & Plan Note (Signed)
Severe but stable On hospice Some ongoing fluid in feet (?slight pleural also) Would not increase diuretic since compensated and has hypotension

## 2014-12-15 NOTE — Assessment & Plan Note (Addendum)
Still regular on amiodarone This is obviously helping her LV function

## 2014-12-15 NOTE — Assessment & Plan Note (Signed)
More of an issue lately Will start miralax every other day

## 2014-12-15 NOTE — Assessment & Plan Note (Signed)
Still occur but much less frequent with the gabapentin

## 2014-12-21 ENCOUNTER — Telehealth: Payer: Self-pay | Admitting: *Deleted

## 2014-12-21 ENCOUNTER — Ambulatory Visit (INDEPENDENT_AMBULATORY_CARE_PROVIDER_SITE_OTHER): Payer: Medicare (Managed Care) | Admitting: Podiatry

## 2014-12-21 DIAGNOSIS — M79676 Pain in unspecified toe(s): Secondary | ICD-10-CM

## 2014-12-21 DIAGNOSIS — B351 Tinea unguium: Secondary | ICD-10-CM

## 2014-12-21 NOTE — Telephone Encounter (Signed)
Call pt back and pt realized that she called [place] in error.

## 2014-12-21 NOTE — Telephone Encounter (Signed)
Pt left only name and phone number.

## 2014-12-21 NOTE — Progress Notes (Signed)
Patient ID: Rita Vasquez, female   DOB: 05-Sep-1915, 81 y.o.   MRN: [id number]  Patient presents to the office today with a chief complaint of painful elongated toenails.  Objective: Pulses are palpable bilateral. Nails are thick yellow dystrophic clinically mycotic and painful palpation.  Assessment: Pain in limb secondary to onychomycosis 1 through 5 bilateral.  Plan: Debridement of nails 1 through 5 bilateral covered service secondary to pain.

## 2014-12-27 ENCOUNTER — Ambulatory Visit: Payer: Medicare Other

## 2014-12-27 ENCOUNTER — Ambulatory Visit: Admitting: Podiatry

## 2015-01-03 ENCOUNTER — Telehealth: Payer: Self-pay | Admitting: Internal Medicine

## 2015-01-03 MED ORDER — SULFAMETHOXAZOLE-TRIMETHOPRIM 800-160 MG PO TABS: 1.0000 | ORAL_TABLET | Freq: Two times a day (BID) | ORAL | Status: DC

## 2015-01-03 NOTE — Telephone Encounter (Signed)
Phone call from [place], hospice RN Urinalysis back from order from [facility] yesterday--increased frequency, trouble with flow, etc No culture yet >30 WBC, + leuk esterase, etc  Will empirically treat with 3 days of septra DS pending the culture report Non specific symptoms so hopefully 3 days will be enough

## 2015-01-05 ENCOUNTER — Encounter: Payer: Self-pay | Admitting: Internal Medicine

## 2015-01-10 NOTE — Telephone Encounter (Signed)
Please see urine culture report scanned into patient's chart.

## 2015-01-11 ENCOUNTER — Encounter: Payer: Self-pay | Admitting: Internal Medicine

## 2015-01-16 DIAGNOSIS — I429 Cardiomyopathy, unspecified: Secondary | ICD-10-CM | POA: Diagnosis not present

## 2015-01-16 DIAGNOSIS — I480 Paroxysmal atrial fibrillation: Secondary | ICD-10-CM

## 2015-01-16 DIAGNOSIS — I479 Paroxysmal tachycardia, unspecified: Secondary | ICD-10-CM | POA: Diagnosis not present

## 2015-01-16 DIAGNOSIS — I5023 Acute on chronic systolic (congestive) heart failure: Secondary | ICD-10-CM | POA: Diagnosis not present

## 2015-01-18 ENCOUNTER — Telehealth: Payer: Self-pay | Admitting: Internal Medicine

## 2015-01-18 DIAGNOSIS — I479 Paroxysmal tachycardia, unspecified: Secondary | ICD-10-CM

## 2015-01-18 DIAGNOSIS — I5023 Acute on chronic systolic (congestive) heart failure: Secondary | ICD-10-CM | POA: Diagnosis not present

## 2015-01-18 DIAGNOSIS — I429 Cardiomyopathy, unspecified: Secondary | ICD-10-CM

## 2015-01-18 DIAGNOSIS — I480 Paroxysmal atrial fibrillation: Secondary | ICD-10-CM

## 2015-01-18 NOTE — Telephone Encounter (Signed)
Form on your desk  

## 2015-01-18 NOTE — Telephone Encounter (Signed)
[place] form was faxed in by a Nolon Rod [phone] to be filled out for the patient. Form was put on Dee's desk.

## 2015-01-19 ENCOUNTER — Ambulatory Visit (INDEPENDENT_AMBULATORY_CARE_PROVIDER_SITE_OTHER): Payer: Medicare Other | Admitting: Podiatry

## 2015-01-19 DIAGNOSIS — B351 Tinea unguium: Secondary | ICD-10-CM

## 2015-01-19 DIAGNOSIS — M79676 Pain in unspecified toe(s): Secondary | ICD-10-CM

## 2015-01-19 DIAGNOSIS — L600 Ingrowing nail: Secondary | ICD-10-CM

## 2015-01-19 NOTE — Progress Notes (Signed)
Patient ID: Rita Vasquez, female   DOB: 07-11-1915, 81 y.o.   MRN: [id number]  Rita Vasquez returns the office today for painful, elongated, thickened toenails. She states the majority of her pain continues to be in the left big toenail although all the nails are painful and elongated. Denies any redness or drainage around the nails. Denies any acute changes since last appointment and no new complaints today. Denies any systemic complaints such as fevers, chills, nausea, vomiting.   Objective: AAO 3, NAD DP/PT pulses palpable, CRT less than 3 seconds Protective sensation intact with Simms Weinstein monofilament, Achilles tendon reflex intact.  Nails hypertrophic, dystrophic, elongated, brittle, discolored. There is ingrowing on the left medial and lateral nail borders. There is no surrounding erythema or drainage along the nail sites. No open lesions or pre-ulcerative lesions are identified. No other areas of tenderness bilateral lower extremities. No overlying edema, erythema, increased warmth. No pain with calf compression, swelling, warmth, erythema.  Assessment: Patient presents with symptomatic onychomycosis; left hallux ingrown toenail.   Plan: -Treatment options including alternatives, risks, complications were discussed -Nails sharply debrided 10 without complication/bleeding. The symptomatic portion of the ingrown toenail was removed without complications/bleeding to patient comfort.  -Discussed daily foot inspection. If there are any changes, to call the office immediately.  -Follow-up as scheduled or sooner if any problems are to arise. In the meantime, encouraged to call the office with any questions, concerns, changes symptoms.  Celesta Gentile, DPM

## 2015-01-20 DIAGNOSIS — Z7689 Persons encountering health services in other specified circumstances: Secondary | ICD-10-CM

## 2015-01-20 NOTE — Telephone Encounter (Signed)
Form done 

## 2015-01-20 NOTE — Telephone Encounter (Signed)
Per cover sheet request from  1 copy sent to [address], [place] [address] 1 copy to charge 1 copy to scan center  Nolon Rod notified

## 2015-01-24 ENCOUNTER — Ambulatory Visit: Payer: Medicare Other | Admitting: Internal Medicine

## 2015-01-24 DIAGNOSIS — I480 Paroxysmal atrial fibrillation: Secondary | ICD-10-CM | POA: Diagnosis not present

## 2015-01-24 DIAGNOSIS — I5022 Chronic systolic (congestive) heart failure: Secondary | ICD-10-CM

## 2015-01-24 DIAGNOSIS — N3000 Acute cystitis without hematuria: Secondary | ICD-10-CM

## 2015-01-24 DIAGNOSIS — R51 Headache: Secondary | ICD-10-CM

## 2015-02-13 DIAGNOSIS — N390 Urinary tract infection, site not specified: Secondary | ICD-10-CM | POA: Diagnosis not present

## 2015-02-23 DIAGNOSIS — I480 Paroxysmal atrial fibrillation: Secondary | ICD-10-CM

## 2015-02-23 DIAGNOSIS — I5022 Chronic systolic (congestive) heart failure: Secondary | ICD-10-CM

## 2015-02-23 DIAGNOSIS — R51 Headache: Secondary | ICD-10-CM | POA: Diagnosis not present

## 2015-03-08 DIAGNOSIS — I429 Cardiomyopathy, unspecified: Secondary | ICD-10-CM | POA: Diagnosis not present

## 2015-03-08 DIAGNOSIS — I480 Paroxysmal atrial fibrillation: Secondary | ICD-10-CM | POA: Diagnosis not present

## 2015-03-08 DIAGNOSIS — I5023 Acute on chronic systolic (congestive) heart failure: Secondary | ICD-10-CM | POA: Diagnosis not present

## 2015-03-08 DIAGNOSIS — I340 Nonrheumatic mitral (valve) insufficiency: Secondary | ICD-10-CM | POA: Diagnosis not present

## 2015-03-22 DIAGNOSIS — I4891 Unspecified atrial fibrillation: Secondary | ICD-10-CM

## 2015-03-22 DIAGNOSIS — R197 Diarrhea, unspecified: Secondary | ICD-10-CM

## 2015-03-22 DIAGNOSIS — I5020 Unspecified systolic (congestive) heart failure: Secondary | ICD-10-CM

## 2015-03-22 DIAGNOSIS — R51 Headache: Secondary | ICD-10-CM

## 2015-03-27 ENCOUNTER — Ambulatory Visit (INDEPENDENT_AMBULATORY_CARE_PROVIDER_SITE_OTHER): Payer: Medicare Other | Admitting: Podiatry

## 2015-03-27 DIAGNOSIS — B351 Tinea unguium: Secondary | ICD-10-CM | POA: Diagnosis not present

## 2015-03-27 DIAGNOSIS — M79676 Pain in unspecified toe(s): Secondary | ICD-10-CM

## 2015-03-27 NOTE — Progress Notes (Signed)
Patient ID: Rita Vasquez, female   DOB: 09-11-1915, 81 y.o.   MRN: [id number]  Patient presents to the office today with a chief complaint of painful elongated toenails.  Objective: Pulses are palpable bilateral. Nails are thick yellow dystrophic clinically mycotic and painful palpation.  Assessment: Pain in limb secondary to onychomycosis 1 through 5 bilateral.  Plan: Debridement of nails 1 through 5 bilateral covered service secondary to pain. 3 mo.  Roselind Messier DPM

## 2015-04-18 DIAGNOSIS — I5022 Chronic systolic (congestive) heart failure: Secondary | ICD-10-CM | POA: Diagnosis not present

## 2015-04-18 DIAGNOSIS — I480 Paroxysmal atrial fibrillation: Secondary | ICD-10-CM | POA: Diagnosis not present

## 2015-04-18 DIAGNOSIS — R51 Headache: Secondary | ICD-10-CM | POA: Diagnosis not present

## 2015-04-25 ENCOUNTER — Ambulatory Visit: Payer: Medicare Other

## 2015-04-28 ENCOUNTER — Encounter: Payer: Self-pay | Admitting: Internal Medicine

## 2015-04-28 DIAGNOSIS — Z0279 Encounter for issue of other medical certificate: Secondary | ICD-10-CM

## 2015-05-05 DIAGNOSIS — I480 Paroxysmal atrial fibrillation: Secondary | ICD-10-CM | POA: Diagnosis not present

## 2015-05-05 DIAGNOSIS — I5023 Acute on chronic systolic (congestive) heart failure: Secondary | ICD-10-CM

## 2015-05-05 DIAGNOSIS — I340 Nonrheumatic mitral (valve) insufficiency: Secondary | ICD-10-CM

## 2015-05-05 DIAGNOSIS — I429 Cardiomyopathy, unspecified: Secondary | ICD-10-CM | POA: Diagnosis not present

## 2015-05-23 DIAGNOSIS — R42 Dizziness and giddiness: Secondary | ICD-10-CM

## 2015-06-26 ENCOUNTER — Ambulatory Visit: Payer: Medicare Other

## 2015-06-26 ENCOUNTER — Ambulatory Visit: Payer: Self-pay | Admitting: Podiatry

## 2015-06-28 DIAGNOSIS — R51 Headache: Secondary | ICD-10-CM | POA: Diagnosis not present

## 2015-06-28 DIAGNOSIS — I5020 Unspecified systolic (congestive) heart failure: Secondary | ICD-10-CM | POA: Diagnosis not present

## 2015-06-28 DIAGNOSIS — I4891 Unspecified atrial fibrillation: Secondary | ICD-10-CM | POA: Diagnosis not present

## 2015-07-07 DIAGNOSIS — I429 Cardiomyopathy, unspecified: Secondary | ICD-10-CM | POA: Diagnosis not present

## 2015-07-07 DIAGNOSIS — I5023 Acute on chronic systolic (congestive) heart failure: Secondary | ICD-10-CM

## 2015-07-07 DIAGNOSIS — I480 Paroxysmal atrial fibrillation: Secondary | ICD-10-CM | POA: Diagnosis not present

## 2015-07-07 DIAGNOSIS — I340 Nonrheumatic mitral (valve) insufficiency: Secondary | ICD-10-CM | POA: Diagnosis not present

## 2015-07-17 ENCOUNTER — Ambulatory Visit (INDEPENDENT_AMBULATORY_CARE_PROVIDER_SITE_OTHER): Payer: Medicare Other | Admitting: Podiatry

## 2015-07-17 ENCOUNTER — Encounter: Payer: Self-pay | Admitting: Podiatry

## 2015-07-17 DIAGNOSIS — B351 Tinea unguium: Secondary | ICD-10-CM

## 2015-07-17 DIAGNOSIS — M79676 Pain in unspecified toe(s): Secondary | ICD-10-CM | POA: Diagnosis not present

## 2015-07-17 NOTE — Progress Notes (Signed)
She presents today with chief complaint of painful elongated toenails 1 through 5 bilateral.  Objective: Vital signs stable alert and oriented 3. Pulses are strongly palpable. Toenails are thick yellow dystrophic with mycotic and painful palpation.  Assessment: Pain in limb secondary to onychomycosis 1 through 5 bilateral.  Plan: Debridement of toenails 1 through 5 bilateral reserve secondary to pain.

## 2015-07-31 DIAGNOSIS — R600 Localized edema: Secondary | ICD-10-CM | POA: Diagnosis not present

## 2015-08-08 DIAGNOSIS — I5023 Acute on chronic systolic (congestive) heart failure: Secondary | ICD-10-CM | POA: Diagnosis not present

## 2015-08-17 DIAGNOSIS — I5022 Chronic systolic (congestive) heart failure: Secondary | ICD-10-CM | POA: Diagnosis not present

## 2015-08-17 DIAGNOSIS — I480 Paroxysmal atrial fibrillation: Secondary | ICD-10-CM | POA: Diagnosis not present

## 2015-08-17 DIAGNOSIS — R51 Headache: Secondary | ICD-10-CM

## 2015-09-16 DEATH — deceased

## 2015-10-16 ENCOUNTER — Ambulatory Visit: Payer: Medicare Other | Admitting: Podiatry
